# Patient Record
Sex: Male | Born: 1975 | Race: White | Hispanic: No | Smoking: Never smoker
Health system: Southern US, Community
[De-identification: ages and names within clinical notes are randomized; demographics above are authoritative.]

## PROBLEM LIST (undated history)

## (undated) HISTORY — PX: CERVICAL SPINE SURGERY: SHX589

---
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
## 2007-02-05 ENCOUNTER — Ambulatory Visit (HOSPITAL_COMMUNITY): Admission: RE | Admit: 2007-02-05 | Discharge: 2007-02-06 | Payer: Self-pay | Admitting: Neurosurgery

## 2010-08-30 NOTE — Op Note (Signed)
NAMEDONIS, PINDER                 ACCOUNT NO.:  [id number]   MEDICAL RECORD NO.:  [id number]          PATIENT TYPE:  AMB   LOCATION:  SDS                          FACILITY:  [facility]   PHYSICIAN:  Danae Orleans. Venetia Maxon, M.D.  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   PROCEDURE:  Anterior cervical decompression and fusion, C6-7, with  allograft bone wedge, morcellized bone autograft and anterior cervical  plate.   SURGEON:  Venetia Maxon, M.D.   ASSISTANTMichail Jewels, R.N., and Lovell Sheehan, M.D.   ANESTHESIA:  General endotracheal anesthesia.   BLOOD LOSS:  150 mL.   COMPLICATIONS:  None.   DISPOSITION:  To recovery.   INDICATIONS:  Chad Campbell is a 35 year old man with a herniated cervical  disk at C6-7 causing significant cord compression.  He has a cervical  myelopathy.  It was elected to take him to surgery for anterior cervical  decompression and fusion at the C6-7 level.   PROCEDURE:  Chad Campbell was brought to the operating room.  Following  satisfactory and uncomplicated induction of general endotracheal  anesthesia, the patient was placed in a supine position on the operating  table.  His neck was placed in neutral alignment.  He was placed in 10  pounds of halter traction.  His anterior neck was then shaved, prepped  and draped in the usual sterile fashion.  Area of planned incision was  infiltrated with 0.25% Marcaine, 0.25% lidocaine, 1:200,000 epinephrine.  Initially, incision was made from the midline to the anterior border of  the sternocleidomastoid muscle on  the left side of midline, carried  sharply through platysmal layer.  Subplatysmal dissection was performed,  exposing the anterior border of the sternocleidomastoid muscle, using  blunt dissection, the carotid sheath was kept lateral and the trachea  and esophagus kept medial, exposing the anterior cervical spine.  Initially, bent spinal needles were placed in what was felt to be the C5-  6 and C6-7 levels but on intraoperative x-ray was not possible to  visualize at this level.  Consequently, an additional needle was placed  that was felt to be at the C4-5 level, and this was confirmed on  intraoperative x-ray.  Subsequently, longus colli muscles were taken  down from C6 and C7 bilaterally using electrocautery and Key elevator,  and self-retaining Shadow-Line retractors were placed to facilitate  exposure.  Subsequently, the interspace at C6-7 was then incised.  Disk  material was removed in a piecemeal fashion, and endplates were stripped  of residual disk material using a variety of Carlen curettes.  Distraction pins were placed at C6 and C7, and using gentle distraction,  the disk space was further evacuated of residual disk material.  Endplates were decorticated with high-speed  drill, and uncinate spurs  were drilled down.  There were multiple fragments of disk material which  were removed from a hole in the posterior longitudinal ligament inflated  to the right of midline, and this resulted in significant decompression  of the cervical spinal cord dura.  Subsequently, under microscopic  visualization, the posterior longitudinal ligament was removed more  completely with decompression of both C7 nerve roots as they extended  out the neural foramina and with decompression of central spinal cord  dura.  Hemostasis was assured with Gelfoam soaked in thrombin.  The  drilling of the endplates were saved for later use with bone graft  material.  After trial sizing, an 8-mm Allograft bone  wedge was  fashioned with a high-speed drill and packed with morcellized bone  autograft and also demineralized bone matrix.  This was then inserted in  the interspace and countersunk appropriately.  Distraction pins were  removed.  A 14-mm Trestle anterior cervical plate was then affixed to  the anterior cervical spine using variable angle 14-mm screws, two at  C6, two at C7.  All screws had excellent purchase.  Locking mechanisms  were engaged.  It was elected not to perform final x-rays.  It was felt  it would not be possible to visualize this level.  Hemostasis assured,  and the soft tissues were inspected and found to be in good repair.  The  platysmal layer was closed with 3-0 Vicryl sutures, and the skin edges  were approximated 3-0 Vicryl interrupted inverted sutures.  The wound  was dressed with Dermabond.  The patient was x-rayed in the operating  room and taken to the recovery in stable satisfactory condition, having  tolerated his operation well.  Counts were correct at the end of the  case.      Danae Orleans. Venetia Maxon, M.D.  Electronically Signed     JDS/MEDQ  D:  02/05/2007  T:  02/06/2007  Job:  [id number]

## 2011-01-25 LAB — CBC
Hemoglobin: 16
MCV: 89.1
Platelets: 279
RBC: 5.26

## 2015-07-31 DIAGNOSIS — R69 Illness, unspecified: Secondary | ICD-10-CM | POA: Diagnosis not present

## 2015-07-31 DIAGNOSIS — J039 Acute tonsillitis, unspecified: Secondary | ICD-10-CM | POA: Diagnosis not present

## 2015-08-02 DIAGNOSIS — J039 Acute tonsillitis, unspecified: Secondary | ICD-10-CM | POA: Diagnosis not present

## 2015-08-02 DIAGNOSIS — J029 Acute pharyngitis, unspecified: Secondary | ICD-10-CM | POA: Diagnosis not present

## 2015-08-03 DIAGNOSIS — J029 Acute pharyngitis, unspecified: Secondary | ICD-10-CM | POA: Diagnosis not present

## 2016-02-03 DIAGNOSIS — Z23 Encounter for immunization: Secondary | ICD-10-CM | POA: Diagnosis not present

## 2016-06-20 DIAGNOSIS — J029 Acute pharyngitis, unspecified: Secondary | ICD-10-CM | POA: Diagnosis not present

## 2017-01-22 DIAGNOSIS — Z23 Encounter for immunization: Secondary | ICD-10-CM | POA: Diagnosis not present

## 2018-01-24 DIAGNOSIS — Z23 Encounter for immunization: Secondary | ICD-10-CM | POA: Diagnosis not present

## 2018-09-02 DIAGNOSIS — M25571 Pain in right ankle and joints of right foot: Secondary | ICD-10-CM | POA: Diagnosis not present

## 2018-09-11 DIAGNOSIS — M25571 Pain in right ankle and joints of right foot: Secondary | ICD-10-CM | POA: Diagnosis not present

## 2019-12-04 DIAGNOSIS — Z03818 Encounter for observation for suspected exposure to other biological agents ruled out: Secondary | ICD-10-CM | POA: Diagnosis not present

## 2019-12-04 DIAGNOSIS — Z20822 Contact with and (suspected) exposure to covid-19: Secondary | ICD-10-CM | POA: Diagnosis not present

## 2020-01-31 DIAGNOSIS — Z23 Encounter for immunization: Secondary | ICD-10-CM | POA: Diagnosis not present

## 2020-04-28 DIAGNOSIS — M19011 Primary osteoarthritis, right shoulder: Secondary | ICD-10-CM | POA: Diagnosis not present

## 2020-04-28 DIAGNOSIS — M25511 Pain in right shoulder: Secondary | ICD-10-CM | POA: Diagnosis not present

## 2020-09-01 DIAGNOSIS — H1045 Other chronic allergic conjunctivitis: Secondary | ICD-10-CM | POA: Diagnosis not present

## 2020-09-01 DIAGNOSIS — H5711 Ocular pain, right eye: Secondary | ICD-10-CM | POA: Diagnosis not present

## 2020-09-01 DIAGNOSIS — H0102A Squamous blepharitis right eye, upper and lower eyelids: Secondary | ICD-10-CM | POA: Diagnosis not present

## 2020-09-01 DIAGNOSIS — H40053 Ocular hypertension, bilateral: Secondary | ICD-10-CM | POA: Diagnosis not present

## 2020-09-09 DIAGNOSIS — R21 Rash and other nonspecific skin eruption: Secondary | ICD-10-CM | POA: Diagnosis not present

## 2020-09-09 DIAGNOSIS — H5711 Ocular pain, right eye: Secondary | ICD-10-CM | POA: Diagnosis not present

## 2021-01-08 ENCOUNTER — Emergency Department (HOSPITAL_COMMUNITY): Payer: BC Managed Care – PPO

## 2021-01-08 ENCOUNTER — Other Ambulatory Visit: Payer: Self-pay

## 2021-01-08 ENCOUNTER — Emergency Department (HOSPITAL_COMMUNITY)
Admission: EM | Admit: 2021-01-08 | Discharge: 2021-01-08 | Disposition: A | Discharge status: Home / Self Care | Payer: BC Managed Care – PPO | Attending: Emergency Medicine | Admitting: Emergency Medicine

## 2021-01-08 ENCOUNTER — Encounter (HOSPITAL_COMMUNITY): Payer: Self-pay | Admitting: Emergency Medicine

## 2021-01-08 DIAGNOSIS — M542 Cervicalgia: Secondary | ICD-10-CM | POA: Diagnosis not present

## 2021-01-08 DIAGNOSIS — R202 Paresthesia of skin: Secondary | ICD-10-CM | POA: Insufficient documentation

## 2021-01-08 MED ORDER — LIDOCAINE 5 % EX PTCH: 1.0000 | MEDICATED_PATCH | CUTANEOUS | 0 refills | Status: DC

## 2021-01-08 MED ORDER — CYCLOBENZAPRINE HCL 10 MG PO TABS
5.0000 mg | ORAL_TABLET | Freq: Once | ORAL | Status: AC
  Administered 2021-01-08: 5 mg via ORAL
  Filled 2021-01-08: qty 1

## 2021-01-08 MED ORDER — CYCLOBENZAPRINE HCL 5 MG PO TABS: 5.0000 mg | ORAL_TABLET | Freq: Three times a day (TID) | ORAL | 0 refills | Status: DC | PRN

## 2021-01-08 MED ORDER — KETOROLAC TROMETHAMINE 60 MG/2ML IM SOLN
60.0000 mg | Freq: Once | INTRAMUSCULAR | Status: AC
  Administered 2021-01-08: 60 mg via INTRAMUSCULAR
  Filled 2021-01-08: qty 2

## 2021-01-08 MED ORDER — LIDOCAINE 5 % EX PTCH
1.0000 | MEDICATED_PATCH | CUTANEOUS | Status: DC
  Administered 2021-01-08: 1 via TRANSDERMAL
  Filled 2021-01-08: qty 1

## 2021-01-08 MED ORDER — OXYCODONE-ACETAMINOPHEN 5-325 MG PO TABS
1.0000 | ORAL_TABLET | Freq: Once | ORAL | Status: AC
  Administered 2021-01-08: 1 via ORAL
  Filled 2021-01-08: qty 1

## 2021-01-08 NOTE — Discharge Instructions (Signed)
You have been seen and discharged from the emergency department.  Follow-up with your primary provider and orthopedics/spine for reevaluation and further care.  Take Tylenol and/or ibuprofen as needed for pain control.  Use the Lidoderm patches as prescribed.  Take muscle relaxer as needed.  Do not mix this medication with alcohol or other sedating medications. Do not drive or do heavy physical activity and to know how this medication affects you.  It may cause drowsiness.  Take home medications as prescribed. If you have any worsening symptoms, weakness in the left upper extremity, difficulty walking or further concerns for your health please return to an emergency department for further evaluation.

## 2021-01-08 NOTE — ED Triage Notes (Signed)
Woke up with L sided neck pain at 4:30am.  Denies injury.  History of cervical fusion.

## 2021-01-08 NOTE — ED Provider Notes (Signed)
[facility] Provider Note   CSN: [id number] Arrival date & time: 01/08/21  0845     History Chief Complaint  Patient presents with   Neck Pain    Chad [facility] is a 45 y.o. male.  HPI   45 year old male with past medical history of cervical spine fusion surgery with Dr. Venetia Maxon in 2008 presents with left sided neck pain. Patient states the pain is atraumatic, woke up with morning with soreness and tightness of left neck/shoulder. Patient has paraesthesias of LUE to hand at baseline which may feel slightly worse than baseline. No weakness or color change in the LUE. No fever or other neurologic deficit.   History reviewed. No pertinent past medical history.  There are no problems to display for this patient.   Past Surgical History:  Procedure Laterality Date   CERVICAL SPINE SURGERY         No family history on file.  Social History   Tobacco Use   Smoking status: Never   Smokeless tobacco: Never  Substance Use Topics   Alcohol use: Not Currently   Drug use: Not Currently    Home Medications Prior to Admission medications   Not on File    Allergies    Patient has no allergy information on record.  Review of Systems   Review of Systems  Constitutional:  Negative for fever.  HENT:  Negative for congestion.   Respiratory:  Negative for shortness of breath.   Cardiovascular:  Negative for chest pain.  Gastrointestinal:  Negative for abdominal pain.  Musculoskeletal:  Positive for neck pain. Negative for back pain.  Skin:  Negative for rash.  Neurological:  Negative for weakness, numbness and headaches.       + Paresthesias of the left upper extremity   Physical Exam Updated Vital Signs BP (!) 131/115 (BP Location: Right Arm)   Pulse 68   Temp 98.5 F (36.9 C) (Oral)   Resp 18   SpO2 98%   Physical Exam Vitals and nursing note reviewed.  Constitutional:      Appearance: Normal appearance.  HENT:     Head:  Normocephalic.     Mouth/Throat:     Mouth: Mucous membranes are moist.  Cardiovascular:     Rate and Rhythm: Normal rate.  Pulmonary:     Effort: Pulmonary effort is normal. No respiratory distress.  Abdominal:     Palpations: Abdomen is soft.     Tenderness: There is no abdominal tenderness.  Musculoskeletal:     Comments: Tenderness to palpation in the left trapezius muscle with mild muscle spasm noted, equal palpable radial pulses, grip strength and left upper extremity strength intact  Skin:    General: Skin is warm.  Neurological:     Mental Status: He is alert and oriented to person, place, and time. Mental status is at baseline.  Psychiatric:        Mood and Affect: Mood normal.    ED Results / Procedures / Treatments   Labs (all labs ordered are listed, but only abnormal results are displayed) Labs Reviewed - No data to display  EKG None  Radiology CT Cervical Spine Wo Contrast  Result Date: 01/08/2021 CLINICAL DATA:  Acute left-sided neck pain without known injury. EXAM: CT CERVICAL SPINE WITHOUT CONTRAST TECHNIQUE: Multidetector CT imaging of the cervical spine was performed without intravenous contrast. Multiplanar CT image reconstructions were also generated. COMPARISON:  February 05, 2007. FINDINGS: Alignment: Normal. Skull base  and vertebrae: No acute fracture. No primary bone lesion or focal pathologic process. Soft tissues and spinal canal: No prevertebral fluid or swelling. No visible canal hematoma. Disc levels: Status post surgical anterior fusion of C6-7. Mild anterior osteophyte formation is noted at C4-5 and C5-6. Upper chest: Negative. Other: None. IMPRESSION: Postsurgical and degenerative changes as described above. No acute abnormality is noted. Electronically Signed   By: Lupita Raider M.D.   On: 01/08/2021 10:33    Procedures Procedures   Medications Ordered in ED Medications  lidocaine (LIDODERM) 5 % 1 patch (has no administration in time range)   cyclobenzaprine (FLEXERIL) tablet 5 mg (has no administration in time range)  ketorolac (TORADOL) injection 60 mg (has no administration in time range)  oxyCODONE-acetaminophen (PERCOCET/ROXICET) 5-325 MG per tablet 1 tablet (1 tablet Oral Given 01/08/21 2130)    ED Course  I have reviewed the triage vital signs and the nursing notes.  Pertinent labs & imaging results that were available during my care of the patient were reviewed by me and considered in my medical decision making (see chart for details).    MDM Rules/Calculators/A&P                            45 year old male with previous C spine surgery presents with left sided neck pain and tightness. VSS, the LUE is neuro in tact although patient complains of acute on chcronci LUE paraesthesias.  CT C spine shows post operative changes with no acute finding. I believe his symtpoms are related to MSK spasm, reproducible with palp[ation. Plan for symptomatic treatment and outpatient follow up. Patient at this time appears safe and stable for discharge and will be treated as an outpatient.  Discharge plan and strict return to ED precautions discussed, patient verbalizes understanding and agreement.  Final Clinical Impression(s) / ED Diagnoses Final diagnoses:  None    Rx / DC Orders ED Discharge Orders     None        Chad Logan, DO 01/08/21 1352

## 2021-01-08 NOTE — ED Provider Notes (Signed)
Emergency Medicine Provider Triage Evaluation Note  [facility] , a 45 y.o. male  was evaluated in triage.  Pt complains of left-sided neck pain for the past 5 hours.  History of cervical fusion by Dr. Venetia Maxon in 2008.  Since then he has had intermittent but manageable pain.  Reports sudden worsening acute pain earlier this morning.  No injury or trauma or specific trigger that he is aware of.  He states that this is the exact symptoms he had prior to getting his cervical fusion.  He reports associated paresthesias down his left arm.  Denies any weakness, headache, vision changes, fever.  Review of Systems  Positive: Neck pain, paresthesias Negative: Headache, blurry vision, weakness  Physical Exam  BP (!) 146/110 (BP Location: Left Arm)   Pulse 82   Temp 98.5 F (36.9 C) (Oral)   Resp 16   SpO2 100%  Gen:   Awake, no distress   Resp:  Normal effort  MSK:   Moves extremities without difficulty  Other:  Tenderness palpation of the cervical spine at the left paraspinal musculature.  Strength 5/5 in upper extremities no facial asymmetry  Medical Decision Making  Medically screening exam initiated at 9:11 AM.  Appropriate orders placed.  PHUC KLUTTZ was informed that the remainder of the evaluation will be completed by another provider, this initial triage assessment does not replace that evaluation, and the importance of remaining in the ED until their evaluation is complete.  Imaging and pain control ordered   Dietrich Pates, PA-C 01/08/21 0912    Horton, Clabe Seal, DO 01/10/21 0730

## 2021-01-11 ENCOUNTER — Other Ambulatory Visit: Payer: Self-pay | Admitting: Orthopedic Surgery

## 2021-01-11 DIAGNOSIS — M542 Cervicalgia: Secondary | ICD-10-CM | POA: Diagnosis not present

## 2021-01-11 DIAGNOSIS — M5412 Radiculopathy, cervical region: Secondary | ICD-10-CM | POA: Diagnosis not present

## 2021-01-11 DIAGNOSIS — Z981 Arthrodesis status: Secondary | ICD-10-CM | POA: Diagnosis not present

## 2021-01-12 ENCOUNTER — Ambulatory Visit
Admission: RE | Admit: 2021-01-12 | Discharge: 2021-01-12 | Disposition: A | Discharge status: Home / Self Care | Payer: BC Managed Care – PPO | Source: Ambulatory Visit | Attending: Orthopedic Surgery | Admitting: Orthopedic Surgery

## 2021-01-12 ENCOUNTER — Other Ambulatory Visit: Payer: Self-pay

## 2021-01-12 DIAGNOSIS — M4802 Spinal stenosis, cervical region: Secondary | ICD-10-CM | POA: Diagnosis not present

## 2021-01-12 DIAGNOSIS — M542 Cervicalgia: Secondary | ICD-10-CM

## 2021-01-12 DIAGNOSIS — M5023 Other cervical disc displacement, cervicothoracic region: Secondary | ICD-10-CM | POA: Diagnosis not present

## 2021-01-12 DIAGNOSIS — M50222 Other cervical disc displacement at C5-C6 level: Secondary | ICD-10-CM | POA: Diagnosis not present

## 2021-01-12 DIAGNOSIS — Z981 Arthrodesis status: Secondary | ICD-10-CM | POA: Diagnosis not present

## 2021-01-12 MED ORDER — GADOBENATE DIMEGLUMINE 529 MG/ML IV SOLN
18.0000 mL | Freq: Once | INTRAVENOUS | Status: AC | PRN
  Administered 2021-01-12: 18 mL via INTRAVENOUS

## 2021-01-17 DIAGNOSIS — M542 Cervicalgia: Secondary | ICD-10-CM | POA: Diagnosis not present

## 2021-01-24 DIAGNOSIS — M2569 Stiffness of other specified joint, not elsewhere classified: Secondary | ICD-10-CM | POA: Diagnosis not present

## 2021-01-24 DIAGNOSIS — M6281 Muscle weakness (generalized): Secondary | ICD-10-CM | POA: Diagnosis not present

## 2021-01-24 DIAGNOSIS — M5412 Radiculopathy, cervical region: Secondary | ICD-10-CM | POA: Diagnosis not present

## 2021-01-24 DIAGNOSIS — R293 Abnormal posture: Secondary | ICD-10-CM | POA: Diagnosis not present

## 2021-01-26 DIAGNOSIS — M6281 Muscle weakness (generalized): Secondary | ICD-10-CM | POA: Diagnosis not present

## 2021-01-26 DIAGNOSIS — Z23 Encounter for immunization: Secondary | ICD-10-CM | POA: Diagnosis not present

## 2021-01-26 DIAGNOSIS — R293 Abnormal posture: Secondary | ICD-10-CM | POA: Diagnosis not present

## 2021-01-26 DIAGNOSIS — M5412 Radiculopathy, cervical region: Secondary | ICD-10-CM | POA: Diagnosis not present

## 2021-01-26 DIAGNOSIS — M2569 Stiffness of other specified joint, not elsewhere classified: Secondary | ICD-10-CM | POA: Diagnosis not present

## 2021-02-03 DIAGNOSIS — M5412 Radiculopathy, cervical region: Secondary | ICD-10-CM | POA: Diagnosis not present

## 2021-02-17 DIAGNOSIS — M5412 Radiculopathy, cervical region: Secondary | ICD-10-CM | POA: Diagnosis not present

## 2021-03-01 DIAGNOSIS — G5603 Carpal tunnel syndrome, bilateral upper limbs: Secondary | ICD-10-CM | POA: Diagnosis not present

## 2021-03-07 DIAGNOSIS — Z6829 Body mass index (BMI) 29.0-29.9, adult: Secondary | ICD-10-CM | POA: Diagnosis not present

## 2021-03-07 DIAGNOSIS — R030 Elevated blood-pressure reading, without diagnosis of hypertension: Secondary | ICD-10-CM | POA: Diagnosis not present

## 2021-03-08 ENCOUNTER — Other Ambulatory Visit: Payer: Self-pay | Admitting: Neurological Surgery

## 2021-03-18 ENCOUNTER — Other Ambulatory Visit: Payer: Self-pay | Admitting: Neurological Surgery

## 2021-03-21 DIAGNOSIS — M542 Cervicalgia: Secondary | ICD-10-CM | POA: Diagnosis not present

## 2021-03-21 DIAGNOSIS — Z6829 Body mass index (BMI) 29.0-29.9, adult: Secondary | ICD-10-CM | POA: Diagnosis not present

## 2021-03-21 DIAGNOSIS — M4802 Spinal stenosis, cervical region: Secondary | ICD-10-CM | POA: Diagnosis not present

## 2021-03-21 DIAGNOSIS — R030 Elevated blood-pressure reading, without diagnosis of hypertension: Secondary | ICD-10-CM | POA: Diagnosis not present

## 2021-03-28 NOTE — Progress Notes (Signed)
Surgical Instructions    Your procedure is scheduled on Friday December 16th.  Report to [facility] Main Entrance "A" at 9:20 A.M., then check in with the Admitting office.  Call this number if you have problems the morning of surgery:  [phone]   If you have any questions prior to your surgery date call [phone]: Open Monday-Friday 8am-4pm    Remember:  Do not eat or drink after midnight the night before your surgery     Take these medicines the morning of surgery with A SIP OF WATER NONE   As of today, STOP taking any Aspirin (unless otherwise instructed by your surgeon) Aleve, Naproxen, Ibuprofen, Motrin, Advil, Goody's, BC's, all herbal medications, fish oil, and all vitamins.     After your COVID test   You are not required to quarantine however you are required to wear a well-fitting mask when you are out and around people not in your household.  If your mask becomes wet or soiled, replace with a new one.  Wash your hands often with soap and water for 20 seconds or clean your hands with an alcohol-based hand sanitizer that contains at least 60% alcohol.  Do not share personal items.  Notify your provider: if you are in close contact with someone who has COVID  or if you develop a fever of 100.4 or greater, sneezing, cough, sore throat, shortness of breath or body aches.             Do not wear jewelry  Do not wear lotions, powders, colognes, or deodorant. Do not shave 48 hours prior to surgery.  Men may shave face and neck. Do not bring valuables to the hospital. DO Not wear nail polish, gel polish, artificial nails, or any other type of covering on natural nails including finger and toenails. If patients have artificial nails, gel coating, etc. that need to be removed by a nail salon, please have this removed prior to surgery or surgery may need to be canceled/delayed if the surgeon/ anesthesia feels like the patient is unable to be adequately monitored.              [facility] is not responsible for any belongings or valuables.  Do NOT Smoke (Tobacco/Vaping)  24 hours prior to your procedure  If you use a CPAP at night, you may bring your mask for your overnight stay.   Contacts, glasses, hearing aids, dentures or partials may not be worn into surgery, please bring cases for these belongings   For patients admitted to the hospital, discharge time will be determined by your treatment team.   Patients discharged the day of surgery will not be allowed to drive home, and someone needs to stay with them for 24 hours.  NO VISITORS WILL BE ALLOWED IN PRE-OP WHERE PATIENTS ARE PREPPED FOR SURGERY.  ONLY 1 SUPPORT PERSON MAY BE PRESENT IN THE WAITING ROOM WHILE YOU ARE IN SURGERY.  IF YOU ARE TO BE ADMITTED, ONCE YOU ARE IN YOUR ROOM YOU WILL BE ALLOWED TWO (2) VISITORS. 1 (ONE) VISITOR MAY STAY OVERNIGHT BUT MUST ARRIVE TO THE ROOM BY 8pm.  Minor children may have two parents present. Special consideration for safety and communication needs will be reviewed on a case by case basis.  Special instructions:    Oral Hygiene is also important to reduce your risk of infection.  Remember - BRUSH YOUR TEETH THE MORNING OF SURGERY WITH YOUR REGULAR TOOTHPASTE   - Preparing For Surgery  Before surgery, you can play an important role. Because skin is not sterile, your skin needs to be as free of germs as possible. You can reduce the number of germs on your skin by washing with CHG (chlorahexidine gluconate) Soap before surgery.  CHG is an antiseptic cleaner which kills germs and bonds with the skin to continue killing germs even after washing.     Please do not use if you have an allergy to CHG or antibacterial soaps. If your skin becomes reddened/irritated stop using the CHG.  Do not shave (including legs and underarms) for at least 48 hours prior to first CHG shower. It is OK to shave your face.  Please follow these instructions carefully.      Shower the NIGHT BEFORE SURGERY and the MORNING OF SURGERY with CHG Soap.   If you chose to wash your hair, wash your hair first as usual with your normal shampoo. After you shampoo, rinse your hair and body thoroughly to remove the shampoo.  Then [employer] and genitals (private parts) with your normal soap and rinse thoroughly to remove soap.  After that Use CHG Soap as you would any other liquid soap. You can apply CHG directly to the skin and wash gently with a scrungie or a clean washcloth.   Apply the CHG Soap to your body ONLY FROM THE NECK DOWN.  Do not use on open wounds or open sores. Avoid contact with your eyes, ears, mouth and genitals (private parts). Wash Face and genitals (private parts)  with your normal soap.   Wash thoroughly, paying special attention to the area where your surgery will be performed.  Thoroughly rinse your body with warm water from the neck down.  DO NOT shower/wash with your normal soap after using and rinsing off the CHG Soap.  Pat yourself dry with a CLEAN TOWEL.  Wear CLEAN PAJAMAS to bed the night before surgery  Place CLEAN SHEETS on your bed the night before your surgery  DO NOT SLEEP WITH PETS.   Day of Surgery:  Take a shower with CHG soap. Wear Clean/Comfortable clothing the morning of surgery Do not apply any deodorants/lotions.   Remember to brush your teeth WITH YOUR REGULAR TOOTHPASTE.   Please read over the following fact sheets that you were given.

## 2021-03-29 ENCOUNTER — Encounter (HOSPITAL_COMMUNITY): Payer: Self-pay

## 2021-03-29 ENCOUNTER — Encounter (HOSPITAL_COMMUNITY)
Admission: RE | Admit: 2021-03-29 | Discharge: 2021-03-29 | Disposition: A | Discharge status: Home / Self Care | Payer: BC Managed Care – PPO | Source: Ambulatory Visit | Attending: Neurological Surgery | Admitting: Neurological Surgery

## 2021-03-29 ENCOUNTER — Other Ambulatory Visit: Payer: Self-pay

## 2021-03-29 VITALS — BP 138/89 | HR 84 | Temp 97.8°F | Resp 18 | Ht 70.0 in | Wt 212.0 lb

## 2021-03-29 DIAGNOSIS — Z01812 Encounter for preprocedural laboratory examination: Secondary | ICD-10-CM | POA: Insufficient documentation

## 2021-03-29 DIAGNOSIS — Z20822 Contact with and (suspected) exposure to covid-19: Secondary | ICD-10-CM | POA: Diagnosis not present

## 2021-03-29 DIAGNOSIS — Z01818 Encounter for other preprocedural examination: Secondary | ICD-10-CM

## 2021-03-29 LAB — TYPE AND SCREEN
ABO/RH(D): O POS
Antibody Screen: NEGATIVE

## 2021-03-29 LAB — SURGICAL PCR SCREEN
MRSA, PCR: NEGATIVE
Staphylococcus aureus: NEGATIVE

## 2021-03-29 LAB — CBC
HCT: 48.6 % (ref 39.0–52.0)
Hemoglobin: 15.8 g/dL (ref 13.0–17.0)
MCH: 29.8 pg (ref 26.0–34.0)
MCHC: 32.5 g/dL (ref 30.0–36.0)
MCV: 91.5 fL (ref 80.0–100.0)
Platelets: 307 10*3/uL (ref 150–400)
RBC: 5.31 MIL/uL (ref 4.22–5.81)
RDW: 13 % (ref 11.5–15.5)
WBC: 6 10*3/uL (ref 4.0–10.5)
nRBC: 0 % (ref 0.0–0.2)

## 2021-03-29 LAB — SARS CORONAVIRUS 2 (TAT 6-24 HRS): SARS Coronavirus 2: NEGATIVE

## 2021-03-29 NOTE — Progress Notes (Signed)
PCP - [facility]  Chest x-ray - Not indicated EKG - Not indicated  COVID TEST-  03/29/21   Anesthesia review: No  Patient denies shortness of breath, fever, cough and chest pain at PAT appointment   All instructions explained to the patient, with a verbal understanding of the material. Patient agrees to go over the instructions while at home for a better understanding. Patient also instructed to wear a mask while in public after being tested for COVID-19. The opportunity to ask questions was provided.

## 2021-04-01 ENCOUNTER — Observation Stay (HOSPITAL_COMMUNITY)
Admission: RE | Admit: 2021-04-01 | Discharge: 2021-04-02 | Disposition: A | Discharge status: Home / Self Care | Payer: BC Managed Care – PPO | Attending: Neurological Surgery | Admitting: Neurological Surgery

## 2021-04-01 ENCOUNTER — Ambulatory Visit (HOSPITAL_COMMUNITY): Payer: BC Managed Care – PPO | Admitting: Certified Registered Nurse Anesthetist

## 2021-04-01 ENCOUNTER — Encounter (HOSPITAL_COMMUNITY): Payer: Self-pay | Admitting: Neurological Surgery

## 2021-04-01 ENCOUNTER — Ambulatory Visit (HOSPITAL_COMMUNITY): Payer: BC Managed Care – PPO

## 2021-04-01 ENCOUNTER — Encounter (HOSPITAL_COMMUNITY)
Admission: RE | Disposition: A | Discharge status: Home / Self Care | Payer: Self-pay | Source: Home / Self Care | Attending: Neurological Surgery

## 2021-04-01 ENCOUNTER — Other Ambulatory Visit: Payer: Self-pay

## 2021-04-01 DIAGNOSIS — M5412 Radiculopathy, cervical region: Secondary | ICD-10-CM | POA: Diagnosis present

## 2021-04-01 DIAGNOSIS — M4322 Fusion of spine, cervical region: Secondary | ICD-10-CM | POA: Diagnosis not present

## 2021-04-01 DIAGNOSIS — Z981 Arthrodesis status: Secondary | ICD-10-CM | POA: Diagnosis not present

## 2021-04-01 DIAGNOSIS — Z419 Encounter for procedure for purposes other than remedying health state, unspecified: Secondary | ICD-10-CM

## 2021-04-01 DIAGNOSIS — M4802 Spinal stenosis, cervical region: Secondary | ICD-10-CM | POA: Diagnosis not present

## 2021-04-01 DIAGNOSIS — M4722 Other spondylosis with radiculopathy, cervical region: Secondary | ICD-10-CM | POA: Diagnosis not present

## 2021-04-01 HISTORY — PX: ANTERIOR CERVICAL DECOMP/DISCECTOMY FUSION: SHX1161

## 2021-04-01 LAB — ABO/RH: ABO/RH(D): O POS

## 2021-04-01 SURGERY — ANTERIOR CERVICAL DECOMPRESSION/DISCECTOMY FUSION 1 LEVEL
Anesthesia: General | Site: Spine Cervical

## 2021-04-01 MED ORDER — SUGAMMADEX SODIUM 200 MG/2ML IV SOLN
INTRAVENOUS | Status: DC | PRN
  Administered 2021-04-01: 200 mg via INTRAVENOUS

## 2021-04-01 MED ORDER — LIDOCAINE 2% (20 MG/ML) 5 ML SYRINGE
INTRAMUSCULAR | Status: DC | PRN
  Administered 2021-04-01: 80 mg via INTRAVENOUS

## 2021-04-01 MED ORDER — SODIUM CHLORIDE 0.9 % IV SOLN: 250.0000 mL | INTRAVENOUS | Status: DC

## 2021-04-01 MED ORDER — ONDANSETRON HCL 4 MG/2ML IJ SOLN: 4.0000 mg | Freq: Four times a day (QID) | INTRAMUSCULAR | Status: DC | PRN

## 2021-04-01 MED ORDER — PHENOL 1.4 % MT LIQD: 1.0000 | OROMUCOSAL | Status: DC | PRN

## 2021-04-01 MED ORDER — LACTATED RINGERS IV SOLN: INTRAVENOUS | Status: DC | PRN

## 2021-04-01 MED ORDER — FENTANYL CITRATE (PF) 100 MCG/2ML IJ SOLN
INTRAMUSCULAR | Status: AC
  Filled 2021-04-01: qty 2

## 2021-04-01 MED ORDER — ONDANSETRON HCL 4 MG PO TABS: 4.0000 mg | ORAL_TABLET | Freq: Four times a day (QID) | ORAL | Status: DC | PRN

## 2021-04-01 MED ORDER — ACETAMINOPHEN 10 MG/ML IV SOLN
INTRAVENOUS | Status: AC
  Filled 2021-04-01: qty 100

## 2021-04-01 MED ORDER — CEFAZOLIN SODIUM-DEXTROSE 2-4 GM/100ML-% IV SOLN
2.0000 g | Freq: Three times a day (TID) | INTRAVENOUS | Status: AC
  Administered 2021-04-01 – 2021-04-02 (×2): 2 g via INTRAVENOUS
  Filled 2021-04-01 (×2): qty 100

## 2021-04-01 MED ORDER — KETOROLAC TROMETHAMINE 15 MG/ML IJ SOLN
15.0000 mg | Freq: Four times a day (QID) | INTRAMUSCULAR | Status: DC
  Administered 2021-04-01 – 2021-04-02 (×3): 15 mg via INTRAVENOUS
  Filled 2021-04-01 (×3): qty 1

## 2021-04-01 MED ORDER — CHLORHEXIDINE GLUCONATE CLOTH 2 % EX PADS: 6.0000 | MEDICATED_PAD | Freq: Once | CUTANEOUS | Status: DC

## 2021-04-01 MED ORDER — ACETAMINOPHEN 325 MG PO TABS: 650.0000 mg | ORAL_TABLET | ORAL | Status: DC | PRN

## 2021-04-01 MED ORDER — FENTANYL CITRATE (PF) 250 MCG/5ML IJ SOLN
INTRAMUSCULAR | Status: AC
  Filled 2021-04-01: qty 5

## 2021-04-01 MED ORDER — DOCUSATE SODIUM 100 MG PO CAPS
100.0000 mg | ORAL_CAPSULE | Freq: Two times a day (BID) | ORAL | Status: DC
  Administered 2021-04-01 – 2021-04-02 (×2): 100 mg via ORAL
  Filled 2021-04-01 (×2): qty 1

## 2021-04-01 MED ORDER — LIDOCAINE 2% (20 MG/ML) 5 ML SYRINGE
INTRAMUSCULAR | Status: AC
  Filled 2021-04-01: qty 5

## 2021-04-01 MED ORDER — HYDROMORPHONE HCL 1 MG/ML IJ SOLN
0.5000 mg | INTRAMUSCULAR | Status: DC | PRN
  Administered 2021-04-01: 0.5 mg via INTRAVENOUS
  Filled 2021-04-01: qty 0.5

## 2021-04-01 MED ORDER — DEXAMETHASONE SODIUM PHOSPHATE 10 MG/ML IJ SOLN
INTRAMUSCULAR | Status: AC
  Filled 2021-04-01: qty 2

## 2021-04-01 MED ORDER — DEXMEDETOMIDINE (PRECEDEX) IN NS 20 MCG/5ML (4 MCG/ML) IV SYRINGE
PREFILLED_SYRINGE | INTRAVENOUS | Status: DC | PRN
  Administered 2021-04-01 (×2): 8 ug via INTRAVENOUS
  Administered 2021-04-01: 4 ug via INTRAVENOUS

## 2021-04-01 MED ORDER — MIDAZOLAM HCL 2 MG/2ML IJ SOLN
INTRAMUSCULAR | Status: DC | PRN
  Administered 2021-04-01: 2 mg via INTRAVENOUS

## 2021-04-01 MED ORDER — HYDROCODONE-ACETAMINOPHEN 7.5-325 MG PO TABS
2.0000 | ORAL_TABLET | ORAL | Status: DC | PRN
  Administered 2021-04-02: 2 via ORAL
  Filled 2021-04-01 (×2): qty 2

## 2021-04-01 MED ORDER — FENTANYL CITRATE (PF) 100 MCG/2ML IJ SOLN
25.0000 ug | INTRAMUSCULAR | Status: DC | PRN
  Administered 2021-04-01 (×3): 50 ug via INTRAVENOUS

## 2021-04-01 MED ORDER — SODIUM CHLORIDE 0.9% FLUSH
3.0000 mL | Freq: Two times a day (BID) | INTRAVENOUS | Status: DC
  Administered 2021-04-01: 3 mL via INTRAVENOUS

## 2021-04-01 MED ORDER — PHENYLEPHRINE 40 MCG/ML (10ML) SYRINGE FOR IV PUSH (FOR BLOOD PRESSURE SUPPORT)
PREFILLED_SYRINGE | INTRAVENOUS | Status: AC
  Filled 2021-04-01: qty 10

## 2021-04-01 MED ORDER — HYDROCODONE-ACETAMINOPHEN 7.5-325 MG PO TABS
1.0000 | ORAL_TABLET | ORAL | Status: DC | PRN
  Administered 2021-04-01 – 2021-04-02 (×3): 1 via ORAL
  Filled 2021-04-01 (×3): qty 1

## 2021-04-01 MED ORDER — ONDANSETRON HCL 4 MG/2ML IJ SOLN
INTRAMUSCULAR | Status: AC
  Filled 2021-04-01: qty 2

## 2021-04-01 MED ORDER — OXYCODONE HCL 5 MG PO TABS: 5.0000 mg | ORAL_TABLET | Freq: Once | ORAL | Status: DC | PRN

## 2021-04-01 MED ORDER — SODIUM CHLORIDE 0.9% FLUSH: 3.0000 mL | INTRAVENOUS | Status: DC | PRN

## 2021-04-01 MED ORDER — METHOCARBAMOL 500 MG PO TABS
500.0000 mg | ORAL_TABLET | Freq: Four times a day (QID) | ORAL | Status: DC | PRN
  Administered 2021-04-01 – 2021-04-02 (×2): 500 mg via ORAL
  Filled 2021-04-01 (×2): qty 1

## 2021-04-01 MED ORDER — ACETAMINOPHEN 650 MG RE SUPP: 650.0000 mg | RECTAL | Status: DC | PRN

## 2021-04-01 MED ORDER — THROMBIN 5000 UNITS EX SOLR
CUTANEOUS | Status: AC
  Filled 2021-04-01: qty 5000

## 2021-04-01 MED ORDER — DEXAMETHASONE SODIUM PHOSPHATE 10 MG/ML IJ SOLN
INTRAMUSCULAR | Status: DC | PRN
  Administered 2021-04-01: 10 mg via INTRAVENOUS

## 2021-04-01 MED ORDER — ONDANSETRON HCL 4 MG/2ML IJ SOLN
INTRAMUSCULAR | Status: DC | PRN
  Administered 2021-04-01: 4 mg via INTRAVENOUS

## 2021-04-01 MED ORDER — FENTANYL CITRATE (PF) 250 MCG/5ML IJ SOLN
INTRAMUSCULAR | Status: DC | PRN
  Administered 2021-04-01: 50 ug via INTRAVENOUS
  Administered 2021-04-01: 100 ug via INTRAVENOUS
  Administered 2021-04-01 (×2): 50 ug via INTRAVENOUS

## 2021-04-01 MED ORDER — ACETAMINOPHEN 10 MG/ML IV SOLN
INTRAVENOUS | Status: DC | PRN
  Administered 2021-04-01: 1000 mg via INTRAVENOUS

## 2021-04-01 MED ORDER — METHOCARBAMOL 1000 MG/10ML IJ SOLN
500.0000 mg | Freq: Four times a day (QID) | INTRAVENOUS | Status: DC | PRN
  Filled 2021-04-01: qty 5

## 2021-04-01 MED ORDER — OXYCODONE HCL 5 MG/5ML PO SOLN: 5.0000 mg | Freq: Once | ORAL | Status: DC | PRN

## 2021-04-01 MED ORDER — MENTHOL 3 MG MT LOZG: 1.0000 | LOZENGE | OROMUCOSAL | Status: DC | PRN

## 2021-04-01 MED ORDER — DEXMEDETOMIDINE (PRECEDEX) IN NS 20 MCG/5ML (4 MCG/ML) IV SYRINGE
PREFILLED_SYRINGE | INTRAVENOUS | Status: AC
  Filled 2021-04-01: qty 5

## 2021-04-01 MED ORDER — THROMBIN 5000 UNITS EX SOLR: OROMUCOSAL | Status: DC | PRN

## 2021-04-01 MED ORDER — ORAL CARE MOUTH RINSE: 15.0000 mL | Freq: Once | OROMUCOSAL | Status: AC

## 2021-04-01 MED ORDER — TRIAMCINOLONE ACETONIDE 40 MG/ML IJ SUSP
INTRAMUSCULAR | Status: AC
  Filled 2021-04-01: qty 5

## 2021-04-01 MED ORDER — ROCURONIUM BROMIDE 10 MG/ML (PF) SYRINGE
PREFILLED_SYRINGE | INTRAVENOUS | Status: DC | PRN
  Administered 2021-04-01: 20 mg via INTRAVENOUS
  Administered 2021-04-01: 70 mg via INTRAVENOUS
  Administered 2021-04-01: 10 mg via INTRAVENOUS

## 2021-04-01 MED ORDER — PROPOFOL 10 MG/ML IV BOLUS
INTRAVENOUS | Status: DC | PRN
  Administered 2021-04-01: 200 mg via INTRAVENOUS

## 2021-04-01 MED ORDER — ROCURONIUM BROMIDE 10 MG/ML (PF) SYRINGE
PREFILLED_SYRINGE | INTRAVENOUS | Status: AC
  Filled 2021-04-01: qty 20

## 2021-04-01 MED ORDER — LIDOCAINE 2% (20 MG/ML) 5 ML SYRINGE
INTRAMUSCULAR | Status: AC
  Filled 2021-04-01: qty 10

## 2021-04-01 MED ORDER — 0.9 % SODIUM CHLORIDE (POUR BTL) OPTIME
TOPICAL | Status: DC | PRN
  Administered 2021-04-01: 1000 mL

## 2021-04-01 MED ORDER — CEFAZOLIN SODIUM-DEXTROSE 2-4 GM/100ML-% IV SOLN
2.0000 g | INTRAVENOUS | Status: AC
  Administered 2021-04-01: 2 g via INTRAVENOUS
  Filled 2021-04-01: qty 100

## 2021-04-01 MED ORDER — ROCURONIUM BROMIDE 10 MG/ML (PF) SYRINGE
PREFILLED_SYRINGE | INTRAVENOUS | Status: AC
  Filled 2021-04-01: qty 10

## 2021-04-01 MED ORDER — LACTATED RINGERS IV SOLN: INTRAVENOUS | Status: DC

## 2021-04-01 MED ORDER — MIDAZOLAM HCL 2 MG/2ML IJ SOLN
INTRAMUSCULAR | Status: AC
  Filled 2021-04-01: qty 2

## 2021-04-01 MED ORDER — CHLORHEXIDINE GLUCONATE 0.12 % MT SOLN
15.0000 mL | Freq: Once | OROMUCOSAL | Status: AC
  Administered 2021-04-01: 15 mL via OROMUCOSAL
  Filled 2021-04-01: qty 15

## 2021-04-01 MED ORDER — PROPOFOL 10 MG/ML IV BOLUS
INTRAVENOUS | Status: AC
  Filled 2021-04-01: qty 20

## 2021-04-01 SURGICAL SUPPLY — 66 items
ADH SKN CLS APL DERMABOND .7 (GAUZE/BANDAGES/DRESSINGS) ×1
APL SKNCLS STERI-STRIP NONHPOA (GAUZE/BANDAGES/DRESSINGS)
BAG COUNTER SPONGE SURGICOUNT (BAG) ×2 IMPLANT
BAG SPNG CNTER NS LX DISP (BAG) ×1
BAG SURGICOUNT SPONGE COUNTING (BAG) ×1
BAND INSRT 18 STRL LF DISP RB (MISCELLANEOUS) ×2
BAND RUBBER #18 3X1/16 STRL (MISCELLANEOUS) ×6 IMPLANT
BENZOIN TINCTURE PRP APPL 2/3 (GAUZE/BANDAGES/DRESSINGS) IMPLANT
BIT DRILL NEURO 2X3.1 SFT TUCH (MISCELLANEOUS) ×1 IMPLANT
BLADE CLIPPER SURG (BLADE) IMPLANT
BUR CARBIDE MATCH 3.0 (BURR) ×3 IMPLANT
CANISTER SUCT 3000ML PPV (MISCELLANEOUS) ×3 IMPLANT
CARTRIDGE OIL MAESTRO DRILL (MISCELLANEOUS) ×1 IMPLANT
CLOSURE WOUND 1/2 X4 (GAUZE/BANDAGES/DRESSINGS) ×1
COVER MAYO STAND STRL (DRAPES) ×6 IMPLANT
DERMABOND ADVANCED (GAUZE/BANDAGES/DRESSINGS) ×2
DERMABOND ADVANCED .7 DNX12 (GAUZE/BANDAGES/DRESSINGS) IMPLANT
DIFFUSER DRILL AIR PNEUMATIC (MISCELLANEOUS) ×3 IMPLANT
DRAPE C-ARM 42X72 X-RAY (DRAPES) ×3 IMPLANT
DRAPE HALF SHEET 40X57 (DRAPES) IMPLANT
DRAPE LAPAROTOMY 100X72X124 (DRAPES) ×3 IMPLANT
DRAPE MICROSCOPE LEICA (MISCELLANEOUS) ×3 IMPLANT
DRILL NEURO 2X3.1 SOFT TOUCH (MISCELLANEOUS) ×3
DRSG OPSITE POSTOP 4X6 (GAUZE/BANDAGES/DRESSINGS) ×2 IMPLANT
DURAPREP 6ML APPLICATOR 50/CS (WOUND CARE) ×3 IMPLANT
ELECT COATED BLADE 2.86 ST (ELECTRODE) ×3 IMPLANT
ELECT REM PT RETURN 9FT ADLT (ELECTROSURGICAL) ×3
ELECTRODE REM PT RTRN 9FT ADLT (ELECTROSURGICAL) ×1 IMPLANT
EVACUATOR 1/8 PVC DRAIN (DRAIN) IMPLANT
GAUZE 4X4 16PLY ~~LOC~~+RFID DBL (SPONGE) IMPLANT
GLOVE EXAM NITRILE LRG STRL (GLOVE) IMPLANT
GLOVE EXAM NITRILE XL STR (GLOVE) IMPLANT
GLOVE EXAM NITRILE XS STR PU (GLOVE) IMPLANT
GLOVE SRG 8 PF TXTR STRL LF DI (GLOVE) ×1 IMPLANT
GLOVE SURG LTX SZ8 (GLOVE) ×6 IMPLANT
GLOVE SURG UNDER POLY LF SZ8 (GLOVE) ×3
GOWN STRL REUS W/ TWL LRG LVL3 (GOWN DISPOSABLE) IMPLANT
GOWN STRL REUS W/ TWL XL LVL3 (GOWN DISPOSABLE) ×1 IMPLANT
GOWN STRL REUS W/TWL 2XL LVL3 (GOWN DISPOSABLE) IMPLANT
GOWN STRL REUS W/TWL LRG LVL3 (GOWN DISPOSABLE)
GOWN STRL REUS W/TWL XL LVL3 (GOWN DISPOSABLE) ×3
HEMOSTAT POWDER KIT SURGIFOAM (HEMOSTASIS) ×3 IMPLANT
KIT BASIN OR (CUSTOM PROCEDURE TRAY) ×3 IMPLANT
KIT TURNOVER KIT B (KITS) ×3 IMPLANT
NDL SPNL 18GX3.5 QUINCKE PK (NEEDLE) ×1 IMPLANT
NEEDLE HYPO 22GX1.5 SAFETY (NEEDLE) ×3 IMPLANT
NEEDLE SPNL 18GX3.5 QUINCKE PK (NEEDLE) ×3 IMPLANT
NS IRRIG 1000ML POUR BTL (IV SOLUTION) ×3 IMPLANT
OIL CARTRIDGE MAESTRO DRILL (MISCELLANEOUS) ×3
PACK LAMINECTOMY NEURO (CUSTOM PROCEDURE TRAY) ×3 IMPLANT
PAD ARMBOARD 7.5X6 YLW CONV (MISCELLANEOUS) ×9 IMPLANT
PIN DISTRACTION 14MM (PIN) ×6 IMPLANT
PLATE CERV CONS OZARK 1X28 (Plate) ×2 IMPLANT
PUTTY BONE 100 VESUVIUS 1CC (Putty) ×2 IMPLANT
SCREW CERV FX OZARK 4.5X14 (Screw) ×4 IMPLANT
SCREW VA ST OZARK 4X14 (Screw) ×4 IMPLANT
SPACER ANGLD CASCAD 16X13X7 7D (Spacer) ×2 IMPLANT
SPONGE INTESTINAL PEANUT (DISPOSABLE) ×9 IMPLANT
SPONGE SURGIFOAM ABS GEL SZ50 (HEMOSTASIS) ×3 IMPLANT
STAPLER VISISTAT 35W (STAPLE) IMPLANT
STRIP CLOSURE SKIN 1/2X4 (GAUZE/BANDAGES/DRESSINGS) ×2 IMPLANT
TAPE SURG TRANSPORE 1 IN (GAUZE/BANDAGES/DRESSINGS) ×1 IMPLANT
TAPE SURGICAL TRANSPORE 1 IN (GAUZE/BANDAGES/DRESSINGS) ×3
TOWEL GREEN STERILE (TOWEL DISPOSABLE) ×3 IMPLANT
TOWEL GREEN STERILE FF (TOWEL DISPOSABLE) ×3 IMPLANT
WATER STERILE IRR 1000ML POUR (IV SOLUTION) ×3 IMPLANT

## 2021-04-01 NOTE — H&P (Signed)
° ° °  Providing Compassionate, Quality Care - Together  NEUROSURGERY HISTORY & PHYSICAL   Chad [facility] is an 45 y.o. male.   Chief Complaint: Bilateral, left greater than right shoulder and arm radiculopathy HPI: This is a 45 year old male with a history of a C6-7 ACDF in 2008, that has had progressively worsening neck pain and bilateral, left greater than right shoulder pain and radiating pain into his fingertips over the past 6 months.  He had undergone multiple conservative measures which did not help him significantly.  MRI cervical spine revealed degenerative disc disease with disc protrusion and stenosis left greater than right at C5-6 adjacent to his previous C6-7 ACDF.  He presents today for surgical intervention.  He has no changes in his symptomatology from his prior visit.  History reviewed. No pertinent past medical history.  Past Surgical History:  Procedure Laterality Date   CERVICAL SPINE SURGERY     KNEE SURGERY Left 1996   tendon repair and patella   WISDOM TOOTH EXTRACTION Bilateral 1997    History reviewed. No pertinent family history. Social History:  reports that he has never smoked. He has never used smokeless tobacco. He reports current alcohol use. He reports that he does not use drugs.  Allergies: No Known Allergies  Medications Prior to Admission  Medication Sig Dispense Refill   cyclobenzaprine (FLEXERIL) 5 MG tablet Take 1 tablet (5 mg total) by mouth 3 (three) times daily as needed for muscle spasms. (Patient not taking: Reported on 03/23/2021) 15 tablet 0   lidocaine (LIDODERM) 5 % Place 1 patch onto the skin daily. Remove & Discard patch within 12 hours or as directed by MD (Patient not taking: Reported on 03/23/2021) 30 patch 0    Results for orders placed or performed during the hospital encounter of 04/01/21 (from the past 48 hour(s))  ABO/Rh     Status: None (Preliminary result)   Collection Time: 04/01/21  9:53 AM  Result Value Ref Range    ABO/RH(D) PENDING    No results found.  ROS All positives and negatives are listed in HPI above  Blood pressure (!) 140/97, pulse 86, temperature 98.2 F (36.8 C), temperature source Oral, resp. rate 18, height 5\' 10"  (1.778 m), weight 93 kg, SpO2 100 %. 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  We discussed all risks, benefits and expected outcomes and he agrees to proceed with surgical intervention.  We did discuss alternatives of treatment.  I answered all of his questions.  Informed consent was obtained.   Thank you for allowing me to participate in this patient's care.  Please do not hesitate to call with questions or concerns.   57, DO Neurosurgeon [facility] Cell: [phone]

## 2021-04-01 NOTE — Anesthesia Preprocedure Evaluation (Signed)
Anesthesia Evaluation  Patient identified by MRN, date of birth, ID band Patient awake    Reviewed: Allergy & Precautions, H&P , NPO status , Patient's Chart, lab work & pertinent test results  Airway Mallampati: II   Neck ROM: full    Dental   Pulmonary neg pulmonary ROS,    breath sounds clear to auscultation       Cardiovascular negative cardio ROS   Rhythm:regular Rate:Normal     Neuro/Psych    GI/Hepatic   Endo/Other    Renal/GU      Musculoskeletal Previous C-spine surgery   Abdominal   Peds  Hematology   Anesthesia Other Findings   Reproductive/Obstetrics                             Anesthesia Physical Anesthesia Plan  ASA: 1  Anesthesia Plan: General   Post-op Pain Management:    Induction: Intravenous  PONV Risk Score and Plan: 2 and Ondansetron, Dexamethasone, Midazolam and Treatment may vary due to age or medical condition  Airway Management Planned: Oral ETT and Video Laryngoscope Planned  Additional Equipment:   Intra-op Plan:   Post-operative Plan: Extubation in OR  Informed Consent: I have reviewed the patients History and Physical, chart, labs and discussed the procedure including the risks, benefits and alternatives for the proposed anesthesia with the patient or authorized representative who has indicated his/her understanding and acceptance.     Dental advisory given  Plan Discussed with: CRNA, Anesthesiologist and Surgeon  Anesthesia Plan Comments:         Anesthesia Quick Evaluation

## 2021-04-01 NOTE — Op Note (Signed)
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 Arthrodesis C5-6, anterior interbody technique  2. Placement of intervertebral biomechanical device C5-6, K2 M [place] titanium interbody device 7 x 13 x 16 mm 3. Placement of anterior instrumentation consisting of interbody plate and screws -K2 M [place] plate with 4.5 x 40 mm screws in C6, 4.0 x 40 mm screws in C5 4. Discectomy at C5-6 for decompression of spinal cord and exiting nerve roots  5.  Exploration of fusion with removal of anterior cervical plate and screws at C6-7 6.  Intraoperative use of autograft, same incision 7. Use of morselized bone allograft  8. Use of intraoperative microscope  SURGEON: Dr. Kendell Bane Via Rosado, DO  ASSISTANT: Docia Barrier, NP  ANESTHESIA: General Endotracheal  EBL: 50 cc   SPECIMENS: None  DRAINS: None  COMPLICATIONS: None immediate  CONDITION: Hemodynamically stable to PACU  HISTORY: Chad Campbell is a 45 y.o. y.o. male who initially presented to the outpatient clinic with signs and symptoms consistent with left greater than right C6 radiculopathy and neck pain. MRI demonstrated adjacent segment disease to his previous C6-7 fusion that was performed in 2008.  He had significant central stenosis without cord compression, with severe left lateral recess and foraminal stenosis and moderate to severe right lateral recess and foraminal stenosis.  He failed conservative measures including pain control and steroid injections. Treatment options were discussed including continued conservative measures, versus surgical exploration of fusion at C6-7, anterior cervical discectomy and interbody fusion at C5-6. After all questions were answered, informed consent was obtained.  All risks, benefits and expected outcomes were discussed and  agreed upon including but not limited to stroke, heart attack, death, esophageal injury, tracheal injury, recurrent laryngeal injury, hardware failure, the need for more surgery.  PROCEDURE IN DETAIL: The patient was brought to the operating room and transferred to the operative table. After induction of general anesthesia, the patient was positioned on the operative table in the supine position with all pressure points meticulously padded. The skin of the neck was then prepped and draped in the usual sterile fashion.  Physician driven timeout was performed.  After timeout was conducted, skin incision was then made sharply with a 10 blade and Bovie electrocautery was used to dissect the subcutaneous tissue until the platysma was identified. The platysma was then divided and undermined. The sternocleidomastoid muscle was then identified and, utilizing natural fascial planes in the neck, the prevertebral fascia was identified and the carotid sheath was retracted laterally and the trachea and esophagus retracted medially. Again using fluoroscopy, the correct disc space was identified.  The plate over V8-5 was identified.  This was removed with removal set, the previous screws were 4.0 mm x 43 mm.  The fusion was adequate across this interspace.  Hemostasis within the previous screw sites were achieved with Surgifoam.  Bovie electrocautery was used to dissect in the subperiosteal plane and elevate the bilateral longus coli muscles at C6 and C5. Self-retaining retractors were then placed under the longus coli muscles bilaterally. At this point, the microscope was draped and brought into the field, and the remainder of the case was done under the microscope using microdissecting technique.  Distraction pins were placed in midline above and below the disc space of C5-6.  The disc space was placed in distraction.  Anterior osteophytes were removed with an  osteophyte rongeur and saved for autograft.  The disc space  was incised sharply and rongeurs were use to initially complete a discectomy. The high-speed drill was then used to complete discectomy until the posterior annulus was identified and removed and the posterior longitudinal ligament was identified. Using microcurettes, the PLL was elevated, and Kerrison rongeurs were used to remove the posterior longitudinal ligament and the ventral thecal sac was identified.  There were multiple sizable central and lateral recess disc herniations, left greater than right. Using a combination of curettes and ronguers, complete decompression of the thecal sac and exiting nerve roots at this level was completed, and verified using micro-nerve hook. The disc space was taken out of distraction.   Having completed our decompression, attention was turned to placement of the intervertebral device. Trial spacers were used to select a 7 mm graft. This graft was then filled with morcellized allograft and autograft, and inserted under live fluoroscopy.  After placement of the intervertebral device, the above anterior cervical plate was selected, and placed across the interspace. Using a high-speed drill, the cortex of the cervical vertebral bodies was punctured, and screws inserted in the level above and below, using the previous screw tracks of the prior fusion. Final fluoroscopic images in AP and lateral projections were taken to confirm good hardware placement.  The screws were locked in place.  At this point, after all counts were verified to be correct, meticulous hemostasis was secured using a combination of bipolar electrocautery and passive hemostatics.  The platysma was closed with 2-0 Vicryl sutures.  The dermis was closed with 3-0 Vicryl sutures.  Skin was closed with skin glue.  Sterile dressing was applied.  The patient tolerated the procedure well and was extubated in the room and taken to the postanesthesia care unit in stable condition.

## 2021-04-01 NOTE — Progress Notes (Signed)
Orthopedic Tech Progress Note Patient Details:  MATHESON VANDEHEI [birth year] [id number]  Patient ID: Hetty Blend, male   DOB: 10-19-1975, 45 y.o.   MRN: [id number]  Delorise Royals Edwing Figley 04/01/2021, 6:41 PM Spoke with RN and patient already has collar

## 2021-04-01 NOTE — Transfer of Care (Signed)
Immediate Anesthesia Transfer of Care Note  Patient: Chad Campbell  Procedure(s) Performed: CERVICAL FIVE-SIX ANTERIOR CERVICAL DECOMPRESSION/DISCECTOMY FUSION WITH EXTENSION OF CERVICAL SIX-SEVEN FUSION (Spine Cervical)  Patient Location: PACU  Anesthesia Type:General  Level of Consciousness: awake, alert  and oriented  Airway & Oxygen Therapy: Patient Spontanous Breathing and Patient connected to nasal cannula oxygen  Post-op Assessment: Report given to RN and Post -op Vital signs reviewed and stable  Post vital signs: Reviewed and stable  Last Vitals:  Vitals Value Taken Time  BP 131/95 04/01/21 1438  Temp    Pulse 97 04/01/21 1442  Resp 50 04/01/21 1442  SpO2 92 % 04/01/21 1442  Vitals shown include unvalidated device data.  Last Pain:  Vitals:   04/01/21 0928  TempSrc:   PainSc: 7          Complications: No notable events documented.

## 2021-04-01 NOTE — Progress Notes (Signed)
° °  Providing Compassionate, Quality Care - Together  NEUROSURGERY PROGRESS NOTE   S: pt s/e in pacu, radiculopathy improved  O: EXAM:  BP (!) 131/95 (BP Location: Right Arm)    Pulse 97    Temp 97.8 F (36.6 C)    Resp 20    Ht 5\' 10"  (1.778 m)    Wt 93 kg    SpO2 92%    BMI 29.41 kg/m   Awake, alert, oriented x3 PERRL Neck soft Trachea midline CNs grossly intact  5/5 BUE/BLE   ASSESSMENT:  45 y.o. 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  Please do not hesitate to call with questions or concerns.   57, DO Neurosurgeon [facility] Cell: [phone]

## 2021-04-01 NOTE — Anesthesia Procedure Notes (Signed)
Procedure Name: Intubation Date/Time: 04/01/2021 12:11 PM Performed by: Epifanio Lesches, CRNA Pre-anesthesia Checklist: Patient identified, Emergency Drugs available, Suction available, Timeout performed and Patient being monitored Patient Re-evaluated:Patient Re-evaluated prior to induction Oxygen Delivery Method: Circle system utilized Preoxygenation: Pre-oxygenation with 100% oxygen Induction Type: IV induction Ventilation: Mask ventilation without difficulty Laryngoscope Size: Glidescope and 4 Grade View: Grade I Tube type: Oral Tube size: 7.5 mm Number of attempts: 1 Airway Equipment and Method: Stylet and Video-laryngoscopy Placement Confirmation: ETT inserted through vocal cords under direct vision, positive ETCO2, CO2 detector and breath sounds checked- equal and bilateral Secured at: 22 cm Tube secured with: Tape Dental Injury: Teeth and Oropharynx as per pre-operative assessment

## 2021-04-02 DIAGNOSIS — M4722 Other spondylosis with radiculopathy, cervical region: Secondary | ICD-10-CM | POA: Diagnosis not present

## 2021-04-02 MED ORDER — METHOCARBAMOL 500 MG PO TABS
500.0000 mg | ORAL_TABLET | Freq: Four times a day (QID) | ORAL | 1 refills | Status: DC | PRN
Start: 2021-04-02 — End: 2021-11-07

## 2021-04-02 MED ORDER — HYDROCODONE-ACETAMINOPHEN 7.5-325 MG PO TABS: 1.0000 | ORAL_TABLET | ORAL | 0 refills | Status: DC | PRN

## 2021-04-02 NOTE — Evaluation (Signed)
Physical Therapy Evaluation Patient Details Name: Chad Campbell MRN: [id number] DOB: [birth year] Today's Date: 04/02/2021  History of Present Illness  45 y.o. male presents to [facility] hospital on 04/01/2021 with neck pain radiating into BUE. MRI cervical spine revealed degenerative disc disease with disc protrusion and stenosis left greater than right at C5-6 adjacent to his previous C6-7 ACDF. Pt underwent C5-6 ACDF and removal of C6-7 hardware on 12/16. PMH includes left patella tendon repair.  Clinical Impression  Pt presents to PT mobilizing at a modI level. Pt ambulates with a brisk pace, demonstrating independent balance at this time. Pt expresses no concerns about mobility or ADL management and the patient is eager to discharge home. Pt has no further acute PT needs. Acute PT signing off.       Recommendations for follow up therapy are one component of a multi-disciplinary discharge planning process, led by the attending physician.  Recommendations may be updated based on patient status, additional functional criteria and insurance authorization.  Follow Up Recommendations No PT follow up    Assistance Recommended at Discharge None  Functional Status Assessment Patient has not had a recent decline in their functional status  Equipment Recommendations  None recommended by PT    Recommendations for Other Services       Precautions / Restrictions Precautions Precautions: Cervical Precaution Booklet Issued: Yes (comment) Restrictions Weight Bearing Restrictions: No      Mobility  Bed Mobility Overal bed mobility: Modified Independent             General bed mobility comments: HOB elevated, PT provides reinforcement to log roll when in flat bed    Transfers Overall transfer level: Independent                      Ambulation/Gait Ambulation/Gait assistance: Independent Gait Distance (Feet): 300 Feet Assistive device: None Gait Pattern/deviations: WFL(Within  Functional Limits) Gait velocity: functional Gait velocity interpretation: >4.37 ft/sec, indicative of normal walking speed      Stairs Stairs: Yes Stairs assistance: Modified independent (Device/Increase time) Stair Management: One rail Right;Alternating pattern;Forwards Number of Stairs: 10    Wheelchair Mobility    Modified Rankin (Stroke Patients Only)       Balance Overall balance assessment: Independent                                           Pertinent Vitals/Pain Pain Assessment: 0-10 Pain Score: 7  Pain Location: neck Pain Descriptors / Indicators: Sore Pain Intervention(s): Monitored during session    Home Living Family/patient expects to be discharged to:: Private residence Living Arrangements: Parent;Children;Spouse/significant other Available Help at Discharge: Family;Available 24 hours/day Type of Home: House Home Access: Stairs to enter Entrance Stairs-Rails: Can reach both Entrance Stairs-Number of Steps: 3 Alternate Level Stairs-Number of Steps: flight Home Layout: Two level;Able to live on main level with bedroom/bathroom Home Equipment: None      Prior Function Prior Level of Function : Independent/Modified Independent                     Hand Dominance        Extremity/Trunk Assessment   Upper Extremity Assessment Upper Extremity Assessment: Overall WFL for tasks assessed    Lower Extremity Assessment Lower Extremity Assessment: Overall WFL for tasks assessed    Cervical / Trunk Assessment Cervical / Trunk Assessment: Neck  Surgery  Communication   Communication: No difficulties  Cognition Arousal/Alertness: Awake/alert Behavior During Therapy: WFL for tasks assessed/performed Overall Cognitive Status: Within Functional Limits for tasks assessed                                          General Comments General comments (skin integrity, edema, etc.): VSS on RA    Exercises      Assessment/Plan    PT Assessment Patient does not need any further PT services  PT Problem List         PT Treatment Interventions      PT Goals (Current goals can be found in the Care Plan section)       Frequency     Barriers to discharge        Co-evaluation               AM-PAC PT "6 Clicks" Mobility  Outcome Measure Help needed turning from your back to your side while in a flat bed without using bedrails?: None Help needed moving from lying on your back to sitting on the side of a flat bed without using bedrails?: None Help needed moving to and from a bed to a chair (including a wheelchair)?: None Help needed standing up from a chair using your arms (e.g., wheelchair or bedside chair)?: None Help needed to walk in hospital room?: None Help needed climbing 3-5 steps with a railing? : None 6 Click Score: 24    End of Session Equipment Utilized During Treatment: Cervical collar Activity Tolerance: Patient tolerated treatment well Patient left: in bed;with call bell/phone within reach;with family/visitor present Nurse Communication: Mobility status PT Visit Diagnosis: Other abnormalities of gait and mobility (R26.89)    Time: 0076-2263 PT Time Calculation (min) (ACUTE ONLY): 9 min   Charges:   PT Evaluation $PT Eval Low Complexity: 1 Low          Arlyss Gandy, PT, DPT Acute Rehabilitation Pager: [phone] Office [phone]   Arlyss Gandy 04/02/2021, 8:19 AM

## 2021-04-02 NOTE — Anesthesia Postprocedure Evaluation (Signed)
Anesthesia Post Note  Patient: Chad Campbell  Procedure(s) Performed: CERVICAL FIVE-SIX ANTERIOR CERVICAL DECOMPRESSION/DISCECTOMY FUSION WITH EXTENSION OF CERVICAL SIX-SEVEN FUSION (Spine Cervical)     Patient location during evaluation: PACU Anesthesia Type: General Level of consciousness: awake and alert Pain management: pain level controlled Vital Signs Assessment: post-procedure vital signs reviewed and stable Respiratory status: spontaneous breathing, nonlabored ventilation, respiratory function stable and patient connected to nasal cannula oxygen Cardiovascular status: blood pressure returned to baseline and stable Postop Assessment: no apparent nausea or vomiting Anesthetic complications: no   No notable events documented.  Last Vitals:  Vitals:   04/01/21 2353 04/02/21 0545  BP: 124/77 (!) 149/84  Pulse: 97 84  Resp: 18 18  Temp: 36.7 C 36.7 C  SpO2: 95% 96%    Last Pain:  Vitals:   04/02/21 0554  TempSrc:   PainSc: Asleep                 Romario Tith S

## 2021-04-02 NOTE — Discharge Summary (Signed)
Physician Discharge Summary  Patient ID: Chad Campbell MRN: [id number] DOB/AGE: 1975/08/03 45 y.o.  Admit date: 04/01/2021 Discharge date: 04/02/2021  Admission Diagnoses: cervical stenosis    Discharge Diagnoses: same   Discharged Condition: good  Hospital Course: The patient was admitted on 04/01/2021 and taken to the operating room where the patient underwent acdf c5-6. The patient tolerated the procedure well and was taken to the recovery room and then to the floor in stable condition. The hospital course was routine. There were no complications. The wound remained clean dry and intact. Pt had appropriate neck soreness. No complaints of arm pain or new N/T/W. The patient remained afebrile with stable vital signs, and tolerated a regular diet. The patient continued to increase activities, and pain was well controlled with oral pain medications.   Consults: None  Significant Diagnostic Studies:  Results for orders placed or performed during the hospital encounter of 04/01/21  ABO/Rh  Result Value Ref Range   ABO/RH(D)      O POS Performed at [facility], [address] [place], [place] [address]     DG Cervical Spine 2 or 3 views  Result Date: 04/01/2021 CLINICAL DATA:  C5-6 fusion. EXAM: CERVICAL SPINE - 2-3 VIEW COMPARISON:  02/17/2021 FINDINGS: ACDF C5-6.  Anterior plate and interbody spacer in good position. Previous hardware at C6-7 appears to have  been removed. IMPRESSION: ACDF C5-6. Electronically Signed   By: Marlan Palau M.D.   On: 04/01/2021 15:59   DG C-Arm 1-60 Min-No Report  Result Date: 04/01/2021 Fluoroscopy was utilized by the requesting physician.  No radiographic interpretation.   DG C-Arm 1-60 Min-No Report  Result Date: 04/01/2021 Fluoroscopy was utilized by the requesting physician.  No radiographic interpretation.   DG C-Arm 1-60 Min-No Report  Result Date: 04/01/2021 Fluoroscopy was utilized by the requesting physician.  No  radiographic interpretation.    Antibiotics:  Anti-infectives (From admission, onward)    Start     Dose/Rate Route Frequency Ordered Stop   04/01/21 1700  ceFAZolin (ANCEF) IVPB 2g/100 mL premix        2 g 200 mL/hr over 30 Minutes Intravenous Every 8 hours 04/01/21 1610 04/02/21 0112   04/01/21 0915  ceFAZolin (ANCEF) IVPB 2g/100 mL premix        2 g 200 mL/hr over 30 Minutes Intravenous On call to O.R. 04/01/21 0907 04/01/21 1216       Discharge Exam: Blood pressure 121/79, pulse 93, temperature 98.4 F (36.9 C), temperature source Oral, resp. rate 20, height 5\' 10"  (1.778 m), weight 93 kg, SpO2 98 %. Neurologic: Grossly normal Dressing dry  Discharge Medications:   Allergies as of 04/02/2021   No Known Allergies      Medication List     STOP taking these medications    cyclobenzaprine 5 MG tablet Commonly known as: FLEXERIL   lidocaine 5 % Commonly known as: Lidoderm       TAKE these medications    HYDROcodone-acetaminophen 7.5-325 MG tablet Commonly known as: NORCO Take 1 tablet by mouth every 4 (four) hours as needed for moderate pain ((score 4 to 6)).   methocarbamol 500 MG tablet Commonly known as: ROBAXIN Take 1 tablet (500 mg total) by mouth every 6 (six) hours as needed for muscle spasms.        Disposition: home   Final Dx: acdf c5-6  Discharge Instructions      Remove dressing in 72 hours   Complete by: As directed  Call MD for:  difficulty breathing, headache or visual disturbances   Complete by: As directed    Call MD for:  persistant nausea and vomiting   Complete by: As directed    Call MD for:  redness, tenderness, or signs of infection (pain, swelling, redness, odor or green/yellow discharge around incision site)   Complete by: As directed    Call MD for:  severe uncontrolled pain   Complete by: As directed    Call MD for:  temperature >100.4   Complete by: As directed    Diet - low sodium heart healthy   Complete by: As  directed    Incentive spirometry RT   Complete by: As directed    Increase activity slowly   Complete by: As directed           Signed: Tia Alert 04/02/2021, 9:13 AM

## 2021-04-02 NOTE — Progress Notes (Signed)
Patient awaiting transport via wheelchair by NT for discharge home; in no acute distress nor complaints of pain nor discomfort; incision on his anterior neck with honeycomb dressing and is clean, dry and intact with Aspen collar on; room was checked and accounted for all his belongings; discharge instructions concerning his medications, incision care, follow up appointment and when to call the doctor as needed were all discussed with patient and his mother by RN and both verbalized understanding on the instructions given.

## 2021-04-02 NOTE — Plan of Care (Signed)

## 2021-04-02 NOTE — Discharge Instructions (Signed)
Wound Care Leave incision open to air. You may shower. Do not scrub directly on incision.  Do not put any creams, lotions, or ointments on incision. Activity Walk each and every day, increasing distance each day. No lifting greater than 5 lbs.  Avoid excessive neck motion. No driving for 2 weeks; may ride as a passenger locally. Wear neck brace at all times except when showering.  If provided soft collar, may wear for comfort unless otherwise instructed. Diet Resume your normal diet.  Return to Work Will be discussed at you follow up appointment. Call Your Doctor If Any of These Occur Redness, drainage, or swelling at the wound.  Temperature greater than 101 degrees. Severe pain not relieved by pain medication. Increased difficulty swallowing. Incision starts to come apart. Follow Up Appt Call today for appointment in 2-3 weeks ([phone]) or for problems.  If you have any hardware placed in your spine, you will need an x-ray before your appointment.  

## 2021-04-02 NOTE — Progress Notes (Signed)
OT Cancellation Note  Patient Details Name: Chad Campbell MRN: [id number] DOB: 1975-09-04   Cancelled Treatment:    Reason Eval/Treat Not Completed: OT screened, no needs identified, discussed status with both PT and patient and both in agreement. will sign off  Ritesh Opara OTR/L acute rehab services Office: [phone]   Wilhemena Durie 04/02/2021, 9:11 AM

## 2021-04-04 ENCOUNTER — Encounter (HOSPITAL_COMMUNITY): Payer: Self-pay | Admitting: Neurological Surgery

## 2021-05-25 DIAGNOSIS — M4802 Spinal stenosis, cervical region: Secondary | ICD-10-CM | POA: Diagnosis not present

## 2021-05-27 DIAGNOSIS — F419 Anxiety disorder, unspecified: Secondary | ICD-10-CM | POA: Diagnosis not present

## 2021-07-05 DIAGNOSIS — F419 Anxiety disorder, unspecified: Secondary | ICD-10-CM | POA: Diagnosis not present

## 2021-09-21 DIAGNOSIS — M542 Cervicalgia: Secondary | ICD-10-CM | POA: Diagnosis not present

## 2021-09-21 DIAGNOSIS — Z6829 Body mass index (BMI) 29.0-29.9, adult: Secondary | ICD-10-CM | POA: Diagnosis not present

## 2021-09-21 DIAGNOSIS — M4802 Spinal stenosis, cervical region: Secondary | ICD-10-CM | POA: Diagnosis not present

## 2021-09-21 DIAGNOSIS — R519 Headache, unspecified: Secondary | ICD-10-CM | POA: Diagnosis not present

## 2021-11-07 ENCOUNTER — Ambulatory Visit: Payer: BC Managed Care – PPO | Admitting: Neurology

## 2021-11-07 ENCOUNTER — Encounter: Payer: Self-pay | Admitting: Neurology

## 2021-11-07 VITALS — BP 133/89 | HR 71 | Ht 71.0 in | Wt 210.0 lb

## 2021-11-07 DIAGNOSIS — H93A1 Pulsatile tinnitus, right ear: Secondary | ICD-10-CM

## 2021-11-07 DIAGNOSIS — H5711 Ocular pain, right eye: Secondary | ICD-10-CM

## 2021-11-07 DIAGNOSIS — R51 Headache with orthostatic component, not elsewhere classified: Secondary | ICD-10-CM | POA: Diagnosis not present

## 2021-11-07 DIAGNOSIS — H5460 Unqualified visual loss, one eye, unspecified: Secondary | ICD-10-CM

## 2021-11-07 DIAGNOSIS — R519 Headache, unspecified: Secondary | ICD-10-CM | POA: Diagnosis not present

## 2021-11-07 DIAGNOSIS — H539 Unspecified visual disturbance: Secondary | ICD-10-CM | POA: Diagnosis not present

## 2021-11-07 DIAGNOSIS — H5703 Miosis: Secondary | ICD-10-CM

## 2021-11-07 DIAGNOSIS — H02409 Unspecified ptosis of unspecified eyelid: Secondary | ICD-10-CM

## 2021-11-07 DIAGNOSIS — Z8249 Family history of ischemic heart disease and other diseases of the circulatory system: Secondary | ICD-10-CM

## 2021-11-07 NOTE — Progress Notes (Signed)
[id number]    Provider:  Dr Jaynee Eagles Requesting Provider: Marvis Moeller, NP Primary Care Provider:  [facility] [place]  CC:  headache  HPI:  Chad Campbell is a 46 y.o. male here as requested by Marvis Moeller, NP for headaches. PMHX cervical radiculopathy.  A year ago his right eye was bothering him, saw eye doctor, like a pulling sensation, not shooting, constant 24x7, headaches on the right side, pulsating/pounding.throbbing, light and sound sensitivity, nausea. Never had headaches, just started one day and progressively made worse. He was thinking it was due to neck pain. Ongoing at least one year. Constant headache. The only relief is when sleep, when supine, laying down makes it goes away IF the eyes are closed, if the eyes are open even laying down won;t help. 7/10 contsantly. Aunt with migraines. Aunt and mother had a brain aneurysm. He is having vision problems in the right eye, blurry, shuts the eye to get relief, watering, no rashes or lesions ever seen, no inciting events when this started, he has pulsating in the ear. Also diplopia, opthalmoplegia at times. No inciting events or new meds at the time. No other focal neurologic deficits, associated symptoms, inciting events or modifiable factors.   Reviewed notes, labs and imaging from outside physicians, which showed :    MRI cervical spine 01-12-2021: IMPRESSION: 1. ACDF C6-7 with solid fusion.  Negative for stenosis 2. Mild to moderate central and left-sided disc protrusion at C5-6 causing mild spinal stenosis.  [facility] Fenton Malling, NP, he returns for 58-month follow-up, he continues to have paresthesias in his bilateral hands left greater than right, wearing wrist splints at night, his ptosis and myosis persist unchanged as well as his xerosis, he seen ophthalmology and obtained eyedrops which have offered no relief, he also notes continued  headaches that began in his temple region and radiate posteriorly, the headaches happen multiple times a week and some are similar to her migraines, also decreased range of motion in the cervical spine, very unusual.  Review of Systems: Patient complains of symptoms per HPI as well as the following symptoms eye pain. Pertinent negatives and positives per HPI. All others negative.   Social History   Socioeconomic History   Marital status: Divorced    Spouse name: Not on file   Number of children: 2   Years of education: Not on file   Highest education level: Not on file  Occupational History   Not on file  Tobacco Use   Smoking status: Never   Smokeless tobacco: Never  Vaping Use   Vaping Use: Never used  Substance and Sexual Activity   Alcohol use: Yes    Comment: occasional   Drug use: Never   Sexual activity: Yes  Other Topics Concern   Not on file  Social History Narrative   Not on file   Social Determinants of Health   Financial Resource Strain: Not on file  Food Insecurity: Not on file  Transportation Needs: Not on file  Physical Activity: Not on file  Stress: Not on file  Social Connections: Not on file  Intimate Partner Violence: Not on file    Family History  Problem Relation Age of Onset   Aneurysm Mother    Aneurysm Maternal Aunt     History reviewed. No pertinent past medical history.  Patient Active Problem List   Diagnosis Date Noted   Cervical radiculopathy at C6 04/01/2021  Past Surgical History:  Procedure Laterality Date   ANTERIOR CERVICAL DECOMP/DISCECTOMY FUSION N/A 04/01/2021   Procedure: CERVICAL FIVE-SIX ANTERIOR CERVICAL DECOMPRESSION/DISCECTOMY FUSION WITH EXTENSION OF CERVICAL SIX-SEVEN FUSION;  Surgeon: Dawley, Alan Mulder, DO;  Location: [facility] OR;  Service: Neurosurgery;  Laterality: N/A;   CERVICAL SPINE SURGERY     KNEE SURGERY Left 1996   tendon repair and patella   WISDOM TOOTH EXTRACTION Bilateral 1997    Current Outpatient  Medications  Medication Sig Dispense Refill   sertraline (ZOLOFT) 25 MG tablet Take 25 mg by mouth daily.     No current facility-administered medications for this visit.    Allergies as of 11/07/2021   (No Known Allergies)    Vitals: BP 133/89   Pulse 71   Ht 5\' 11"  (1.803 m)   Wt 210 lb (95.3 kg)   BMI 29.29 kg/m  Last Weight:  Wt Readings from Last 1 Encounters:  11/07/21 210 lb (95.3 kg)   Last Height:   Ht Readings from Last 1 Encounters:  11/07/21 5\' 11"  (1.803 m)     Physical exam: Exam: Gen: NAD, conversant, well nourised, well groomed                     CV: RRR, no MRG. No Carotid Bruits. No peripheral edema, warm, nontender Eyes: Conjunctivae clear without exudates or hemorrhage  Neuro: Detailed Neurologic Exam  Speech:    Speech is normal; fluent and spontaneous with normal comprehension.  Cognition:    The patient is oriented to person, place, and time;     recent and remote memory intact;     language fluent;     normal attention, concentration,     fund of knowledge Cranial Nerves:    The pupils are round, and reactive to light but unequal right 34mm and left 70mm. The fundi are flat. Visual fields are full to finger confrontation. Extraocular movements are intact. Trigeminal sensation is intact and the muscles of mastication are normal. Asymmtry of lids. The palate elevates in the midline. Hearing intact. Voice is normal. Shoulder shrug is normal. The tongue has normal motion without fasciculations.   Coordination:    Normal   Gait:     normal.   Motor Observation:    No asymmetry, no atrophy, and no involuntary movements noted. Tone:    Normal muscle tone.    Posture:    Posture is normal. normal erect    Strength:    Strength is V/V in the upper and lower limbs.      Sensation: intact to LT     Reflex Exam:  DTR's:    Deep tendon reflexes in the upper and lower extremities are normal bilaterally.   Toes:    The toes are downgoing  bilaterally.   Clonus:    Clonus is absent.    Assessment/Plan:  46 year old with concerning right eye pain ongling for one year with atypical features: Could be migrainous but very atypical for a migraine disorder.   MRI brain due to concerning symptoms of morning headaches, positional and chronic daily headaches,vision changes and right eye pain, temporal right headaches, worsening headaches  to look for space occupying mass, chiari or intracranial hypertension (pseudotumor), strokes, malignancies, vasculidities, demyelination(multiple sclerosis) or other, Also pulsatile tinnitus, ptosis, pupil asymmetry - need MRA of the head to rule out aneurysm. MRI orbits to evaluate for problems in the globe, orbital pseudotumor(hurts to move eye), optic neuropathy, tolosa hunt, cavernous sinus lesion  and wide variety of other etiologies. Also unusual asymmetry of the lids and pupil diameter difference, Fhx 1st degree ophthalmic aneurysm.    Orders Placed This Encounter  Procedures   MR BRAIN W WO CONTRAST   MR ANGIO HEAD WO CONTRAST   MR ORBITS W WO CONTRAST   CBC with Differential/Platelets   Comprehensive metabolic panel   TSH   Sedimentation rate   C-reactive protein   Ambulatory referral to Ophthalmology    Cc: Council Mechanic, NP,  [place] [place]  Naomie Dean, MD  [facility] [address] [place], [place] [address]  Phone [phone] Fax [phone]  I spent 60 minutes minutes of face-to-face and non-face-to-face time with patient on the  1. Positional headache   2. Pulsatile tinnitus of right ear   3. Vision changes   4. Worsening headaches   5. Morning headache   6. Chronic daily headache   7. Right temporal headache   8. FHx: brain aneurysm   9. Pain in right eye   10. 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  This included previsit chart review, lab  review, study review, order entry, electronic health record documentation, patient education on the different diagnostic and therapeutic options, counseling and coordination of care, risks and benefits of management, compliance, or risk factor reduction

## 2021-11-07 NOTE — Patient Instructions (Signed)
MRIs of the brain/eyes and MRA blood vessels Blood work Evaluation ophthalmology: Dr. Dione Booze Come back after that to discuss  Orders Placed This Encounter  Procedures   MR BRAIN W WO CONTRAST   MR ANGIO HEAD WO CONTRAST   MR ORBITS W WO CONTRAST   CBC with Differential/Platelets   Comprehensive metabolic panel   TSH   Sedimentation rate   C-reactive protein   Ambulatory referral to Ophthalmology

## 2021-11-08 ENCOUNTER — Telehealth: Payer: Self-pay | Admitting: Neurology

## 2021-11-08 LAB — CBC WITH DIFFERENTIAL/PLATELET
Basophils Absolute: 0.1 10*3/uL (ref 0.0–0.2)
Basos: 1 %
EOS (ABSOLUTE): 0.1 10*3/uL (ref 0.0–0.4)
Eos: 1 %
Hematocrit: 46 % (ref 37.5–51.0)
Hemoglobin: 15.2 g/dL (ref 13.0–17.7)
Immature Grans (Abs): 0 10*3/uL (ref 0.0–0.1)
Immature Granulocytes: 0 %
Lymphocytes Absolute: 1.4 10*3/uL (ref 0.7–3.1)
Lymphs: 28 %
MCH: 30.5 pg (ref 26.6–33.0)
MCHC: 33 g/dL (ref 31.5–35.7)
MCV: 92 fL (ref 79–97)
Monocytes Absolute: 0.5 10*3/uL (ref 0.1–0.9)
Monocytes: 10 %
Neutrophils Absolute: 3.1 10*3/uL (ref 1.4–7.0)
Neutrophils: 60 %
Platelets: 289 10*3/uL (ref 150–450)
RBC: 4.99 x10E6/uL (ref 4.14–5.80)
RDW: 13.2 % (ref 11.6–15.4)
WBC: 5.2 10*3/uL (ref 3.4–10.8)

## 2021-11-08 LAB — COMPREHENSIVE METABOLIC PANEL
ALT: 26 IU/L (ref 0–44)
AST: 28 IU/L (ref 0–40)
Albumin/Globulin Ratio: 1.9 (ref 1.2–2.2)
Albumin: 4.9 g/dL (ref 4.1–5.1)
Alkaline Phosphatase: 95 IU/L (ref 44–121)
BUN/Creatinine Ratio: 13 (ref 9–20)
BUN: 14 mg/dL (ref 6–24)
Bilirubin Total: 0.5 mg/dL (ref 0.0–1.2)
CO2: 23 mmol/L (ref 20–29)
Calcium: 9.6 mg/dL (ref 8.7–10.2)
Chloride: 101 mmol/L (ref 96–106)
Creatinine, Ser: 1.12 mg/dL (ref 0.76–1.27)
Globulin, Total: 2.6 g/dL (ref 1.5–4.5)
Glucose: 90 mg/dL (ref 70–99)
Potassium: 4.9 mmol/L (ref 3.5–5.2)
Sodium: 140 mmol/L (ref 134–144)
Total Protein: 7.5 g/dL (ref 6.0–8.5)
eGFR: 84 mL/min/{1.73_m2} (ref >59)

## 2021-11-08 LAB — TSH: TSH: 1.24 u[IU]/mL (ref 0.450–4.500)

## 2021-11-08 LAB — SEDIMENTATION RATE: Sed Rate: 2 mm/hr (ref 0–15)

## 2021-11-08 LAB — C-REACTIVE PROTEIN: CRP: <1 mg/L (ref 0–10)

## 2021-11-08 NOTE — Telephone Encounter (Signed)
90 mins MRI brain w/wo & MRI orbits w/wo Dr. Valaria Good Berkley Harvey: [id number] exp. 11/08/21-01/06/22 scheduled at [facility] 11/09/21 at 8am  30 mins MRA head wo contrast Dr. Thereasa Parkin: [id number] exp. 11/08/21-01/06/22 scheduled at [facility] 11/15/21 at 7:30am

## 2021-11-08 NOTE — Telephone Encounter (Signed)
Referral for Ophthalmology sent to [facility] [phone]. 

## 2021-11-09 ENCOUNTER — Ambulatory Visit: Payer: BC Managed Care – PPO

## 2021-11-09 DIAGNOSIS — H5460 Unqualified visual loss, one eye, unspecified: Secondary | ICD-10-CM | POA: Diagnosis not present

## 2021-11-09 DIAGNOSIS — H93A1 Pulsatile tinnitus, right ear: Secondary | ICD-10-CM

## 2021-11-09 DIAGNOSIS — R519 Headache, unspecified: Secondary | ICD-10-CM

## 2021-11-09 DIAGNOSIS — H02409 Unspecified ptosis of unspecified eyelid: Secondary | ICD-10-CM

## 2021-11-09 DIAGNOSIS — H539 Unspecified visual disturbance: Secondary | ICD-10-CM

## 2021-11-09 DIAGNOSIS — H5711 Ocular pain, right eye: Secondary | ICD-10-CM

## 2021-11-09 DIAGNOSIS — Z8249 Family history of ischemic heart disease and other diseases of the circulatory system: Secondary | ICD-10-CM

## 2021-11-09 DIAGNOSIS — R51 Headache with orthostatic component, not elsewhere classified: Secondary | ICD-10-CM | POA: Diagnosis not present

## 2021-11-09 DIAGNOSIS — H5703 Miosis: Secondary | ICD-10-CM

## 2021-11-09 MED ORDER — GADOBENATE DIMEGLUMINE 529 MG/ML IV SOLN
20.0000 mL | Freq: Once | INTRAVENOUS | Status: AC | PRN
  Administered 2021-11-09: 20 mL via INTRAVENOUS

## 2021-11-15 ENCOUNTER — Ambulatory Visit (INDEPENDENT_AMBULATORY_CARE_PROVIDER_SITE_OTHER): Payer: BC Managed Care – PPO

## 2021-11-15 ENCOUNTER — Telehealth: Payer: Self-pay | Admitting: *Deleted

## 2021-11-15 DIAGNOSIS — R51 Headache with orthostatic component, not elsewhere classified: Secondary | ICD-10-CM

## 2021-11-15 DIAGNOSIS — H539 Unspecified visual disturbance: Secondary | ICD-10-CM | POA: Diagnosis not present

## 2021-11-15 DIAGNOSIS — H5460 Unqualified visual loss, one eye, unspecified: Secondary | ICD-10-CM

## 2021-11-15 DIAGNOSIS — H02409 Unspecified ptosis of unspecified eyelid: Secondary | ICD-10-CM

## 2021-11-15 DIAGNOSIS — H5703 Miosis: Secondary | ICD-10-CM

## 2021-11-15 DIAGNOSIS — R519 Headache, unspecified: Secondary | ICD-10-CM

## 2021-11-15 DIAGNOSIS — H93A1 Pulsatile tinnitus, right ear: Secondary | ICD-10-CM

## 2021-11-15 DIAGNOSIS — H5711 Ocular pain, right eye: Secondary | ICD-10-CM

## 2021-11-15 DIAGNOSIS — Z8249 Family history of ischemic heart disease and other diseases of the circulatory system: Secondary | ICD-10-CM

## 2021-11-15 NOTE — Telephone Encounter (Signed)
Called pt & let him know that MRI brain and orbits are unremarkable, nothing concerning which is great news. Pt was very appreciative and verbalized understanding. He did not have any questions at the time of the call.

## 2021-11-15 NOTE — Telephone Encounter (Signed)
-----   Message from Anson Fret, MD sent at 11/15/2021 12:01 PM EDT ----- MRI of the brain and orbits unremarkable, nothing concerning great news thanks

## 2021-11-17 ENCOUNTER — Telehealth: Payer: Self-pay

## 2021-11-17 NOTE — Telephone Encounter (Signed)
-----   Message from Anson Fret, MD sent at 11/16/2021  7:52 PM EDT ----- Blood vessels of head normal, great news thanks

## 2021-11-17 NOTE — Telephone Encounter (Signed)
I called pt and relayed results. He verbalized understanding and appreciation for the call.  

## 2021-11-21 DIAGNOSIS — H04123 Dry eye syndrome of bilateral lacrimal glands: Secondary | ICD-10-CM | POA: Diagnosis not present

## 2021-11-21 DIAGNOSIS — H40051 Ocular hypertension, right eye: Secondary | ICD-10-CM | POA: Diagnosis not present

## 2021-11-21 DIAGNOSIS — H5711 Ocular pain, right eye: Secondary | ICD-10-CM | POA: Diagnosis not present

## 2021-11-21 DIAGNOSIS — H0102A Squamous blepharitis right eye, upper and lower eyelids: Secondary | ICD-10-CM | POA: Diagnosis not present

## 2022-04-07 DIAGNOSIS — F4322 Adjustment disorder with anxiety: Secondary | ICD-10-CM | POA: Diagnosis not present

## 2022-07-28 DIAGNOSIS — F419 Anxiety disorder, unspecified: Secondary | ICD-10-CM | POA: Diagnosis not present

## 2022-07-28 DIAGNOSIS — Z79899 Other long term (current) drug therapy: Secondary | ICD-10-CM | POA: Diagnosis not present

## 2022-11-24 DIAGNOSIS — F5101 Primary insomnia: Secondary | ICD-10-CM | POA: Diagnosis not present

## 2022-11-24 DIAGNOSIS — F411 Generalized anxiety disorder: Secondary | ICD-10-CM | POA: Diagnosis not present

## 2023-02-09 DIAGNOSIS — F419 Anxiety disorder, unspecified: Secondary | ICD-10-CM | POA: Diagnosis not present

## 2023-02-09 DIAGNOSIS — Z5181 Encounter for therapeutic drug level monitoring: Secondary | ICD-10-CM | POA: Diagnosis not present

## 2023-05-11 DIAGNOSIS — F411 Generalized anxiety disorder: Secondary | ICD-10-CM | POA: Diagnosis not present

## 2023-05-11 DIAGNOSIS — F5101 Primary insomnia: Secondary | ICD-10-CM | POA: Diagnosis not present

## 2023-08-04 IMAGING — CT CT CERVICAL SPINE W/O CM
4 series · 15 of 33 positions shown, 18 images · non-contrast
Comparison: February 05, 2007.

CLINICAL DATA: Acute left-sided neck pain without known injury.

EXAM:
CT CERVICAL SPINE WITHOUT CONTRAST
TECHNIQUE: Multidetector CT imaging of the cervical spine was performed without
intravenous contrast. Multiplanar CT image reconstructions were also
generated.

[Series 5: c_spine 2.0 st · axial · 0.28mm/px · z∈[+878,+992]mm · 5 of 87 slices shown, 7 images]
[im 15/87  soft-tissue]
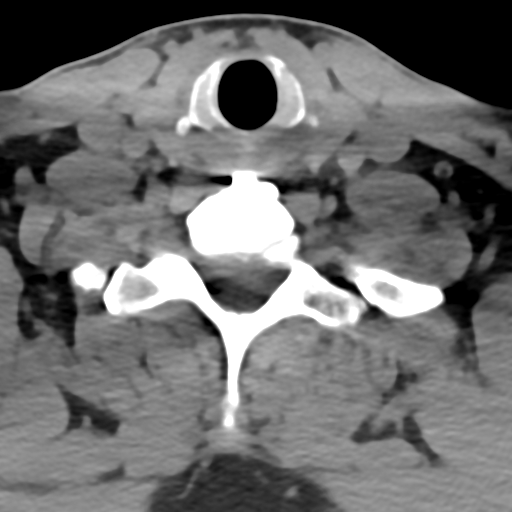
[im 15/87  bone]
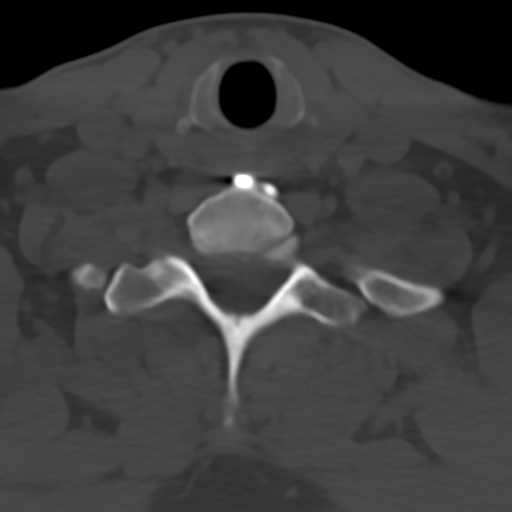
[im 29/87  bone]
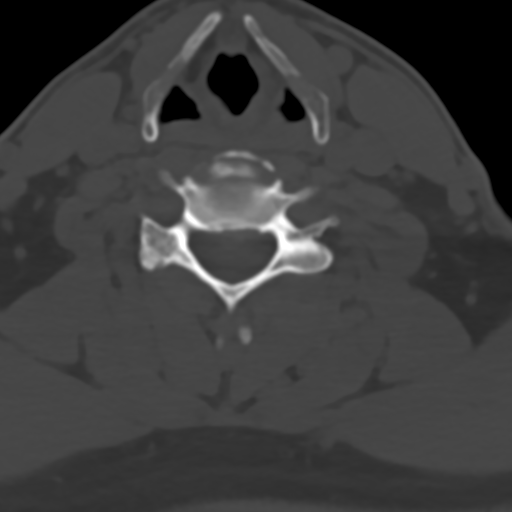
[im 44/87  bone]
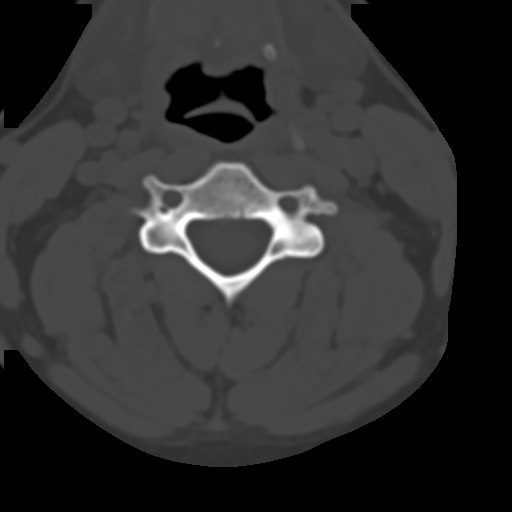
[im 58/87  bone]
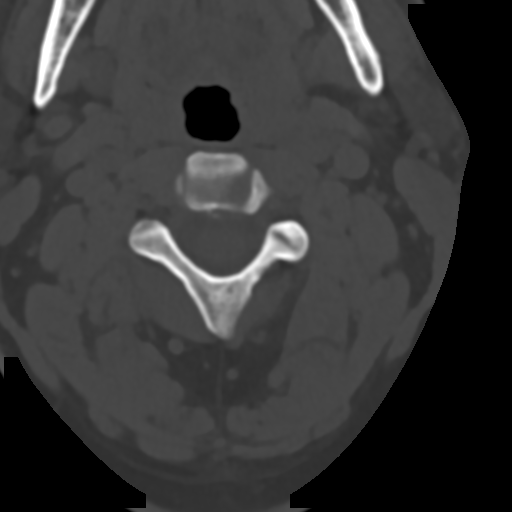
[im 72/87  soft-tissue]
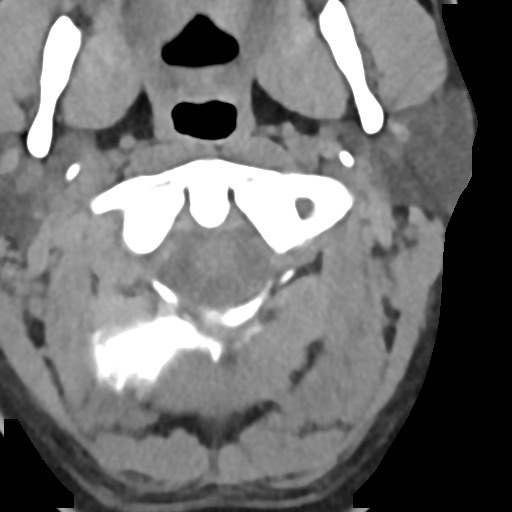
[im 72/87  bone]
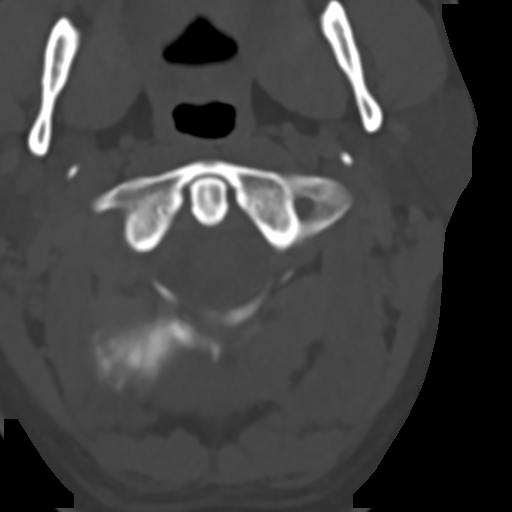

[Series 7: c_spine 2.0 sag bone · sagittal · 0.31mm/px · 5 of 52 slices shown, 6 images]
[im 18/52  bone]
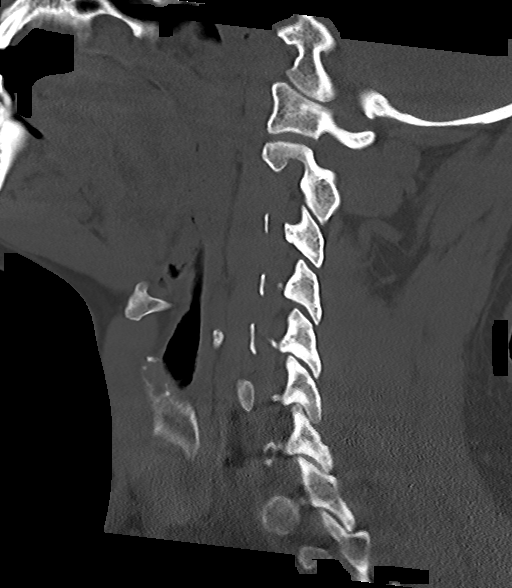
[im 22/52  bone]
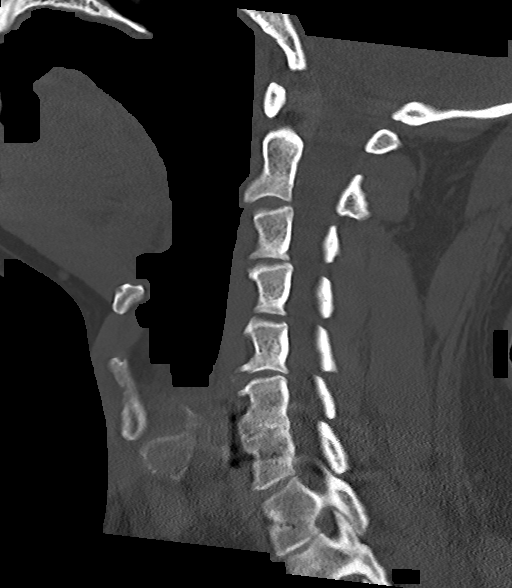
[im 26/52  soft-tissue]
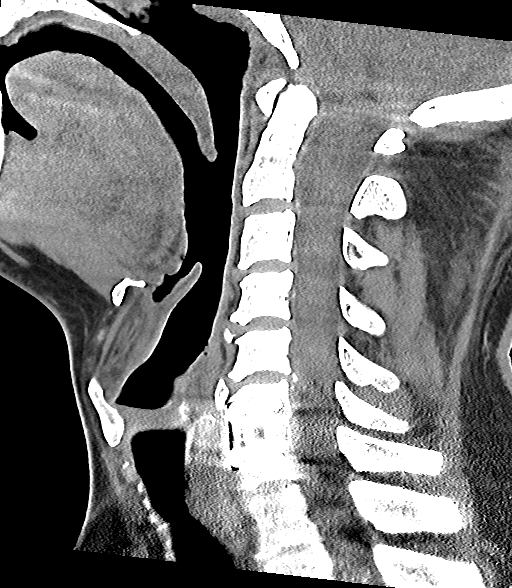
[im 26/52  bone]
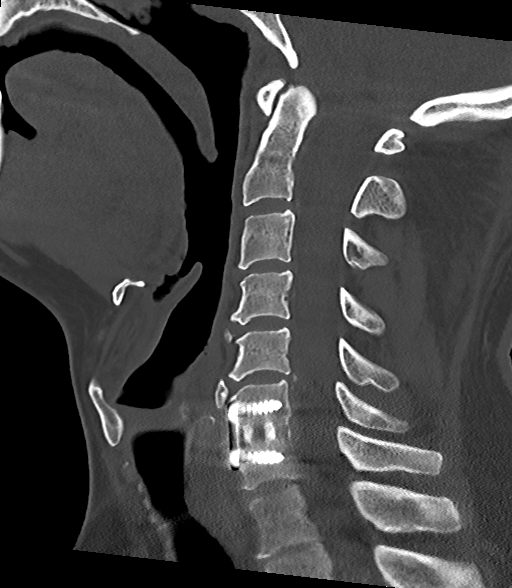
[im 30/52  bone]
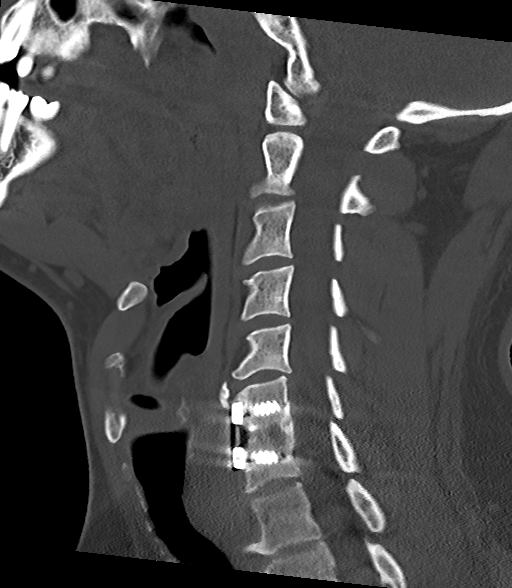
[im 35/52  bone]
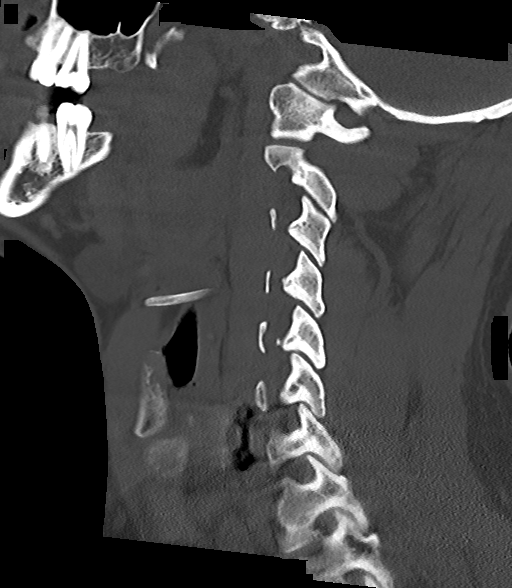

[Series 8: c_spine 2.0 cor bone · coronal · 0.30mm/px · 3 of 64 slices shown]
[im 13/64  bone]
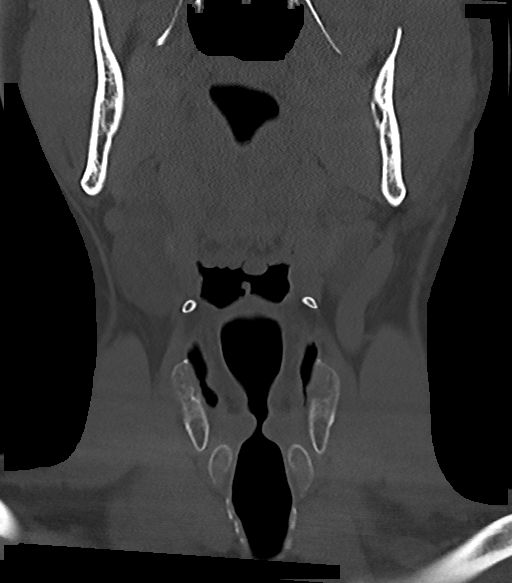
[im 26/64  bone]
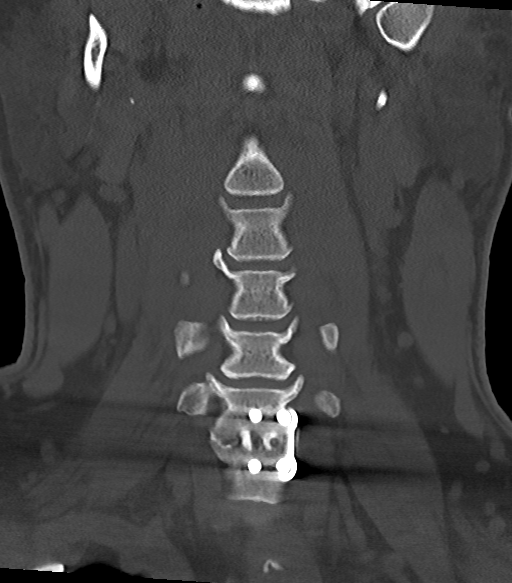
[im 38/64  bone]
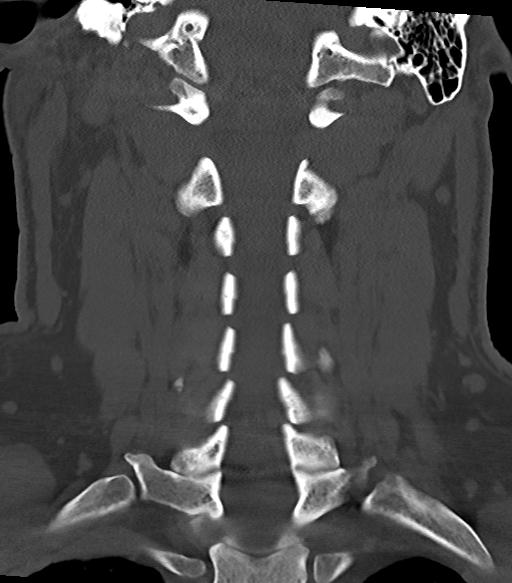

[Series 9: c_spine 2.0 orthogonals · axial · 0.30mm/px · z∈[+860,+889]mm · 2 of 85 slices shown]
[im 15/85  bone]
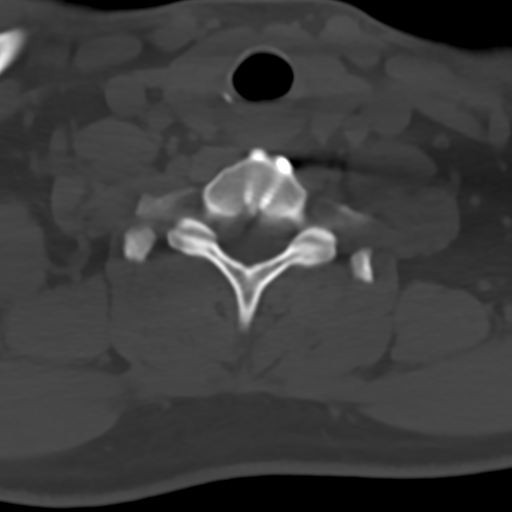
[im 29/85  bone]
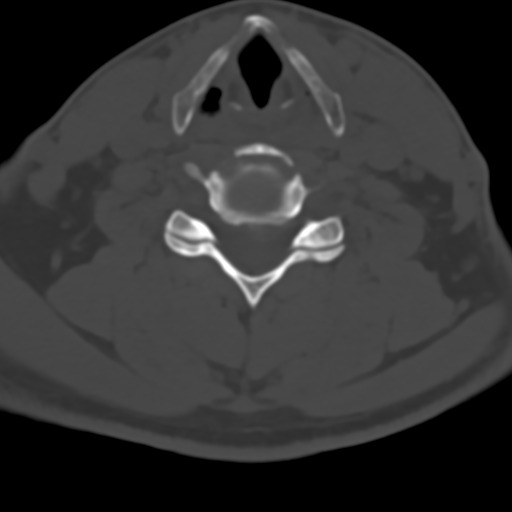

[15 of 33 positions shown; findings below may reference images not displayed]

FINDINGS: Alignment: Normal.

Skull base and vertebrae: No acute fracture. No primary bone lesion
or focal pathologic process.

Soft tissues and spinal canal: No prevertebral fluid or swelling. No
visible canal hematoma.

Disc levels: Status post surgical anterior fusion of C6-7. Mild
anterior osteophyte formation is noted at C4-5 and C5-6.

Upper chest: Negative.

Other: None.
IMPRESSION: Postsurgical and degenerative changes as described above. No acute
abnormality is noted.

## 2023-08-08 IMAGING — MR MR CERVICAL SPINE WO/W CM
5 of 8 series · 27 of 48 positions shown · IV contrast (multihance)
Comparison: CT cervical spine 01/08/2021

CLINICAL DATA: Severe neck pain with left arm pain

EXAM:
MRI CERVICAL SPINE WITHOUT AND WITH CONTRAST
TECHNIQUE: Multiplanar and multiecho pulse sequences of the cervical spine, to
include the craniocervical junction and cervicothoracic junction,
were obtained without and with intravenous contrast.
CONTRAST:  18mL MULTIHANCE GADOBENATE DIMEGLUMINE 529 MG/ML IV SOLN

[Series 2: T2 · sagittal · 3.0mm · 0.66mm/px · 4 of 15 slices shown (1 of 2)]
[im 1/15]
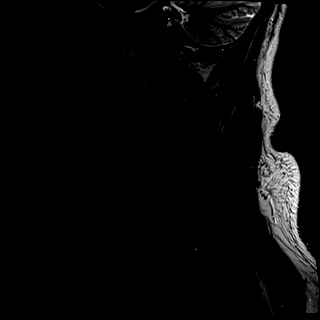
[im 5/15]
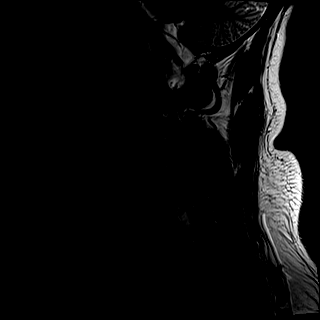
[im 10/15]
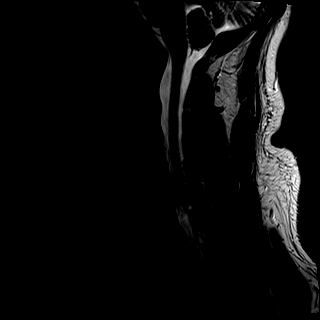
[im 15/15]
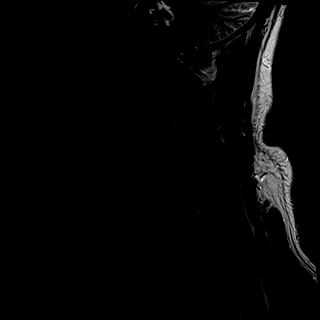

[Series 3: T1 · sagittal · 3.0mm · 0.41mm/px · 4 of 15 slices shown (1 of 3)]
[im 1/15]
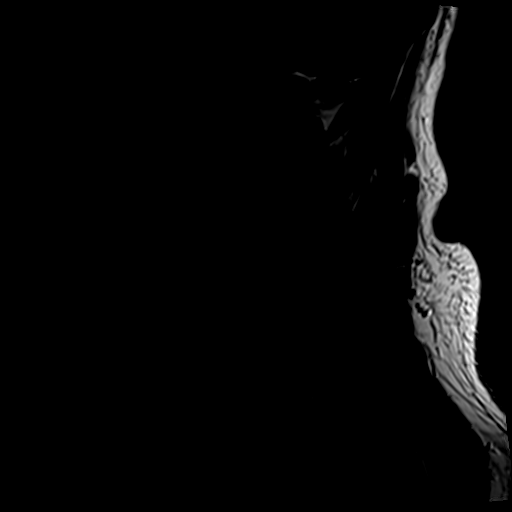
[im 5/15]
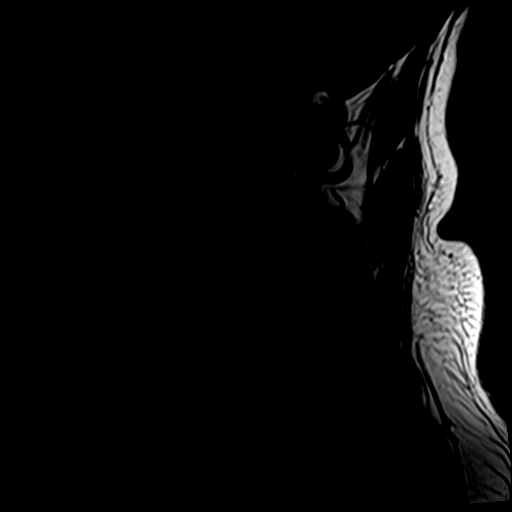
[im 10/15]
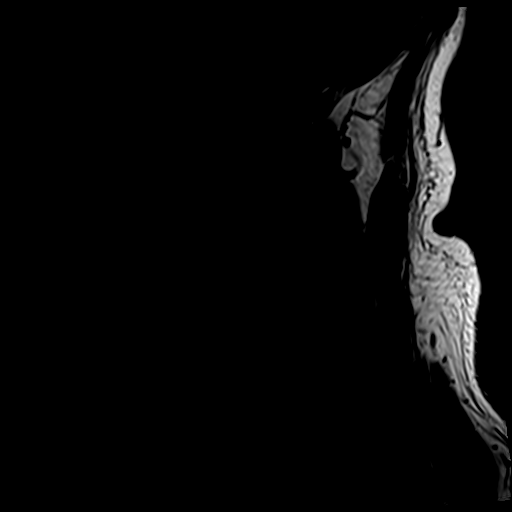
[im 15/15]
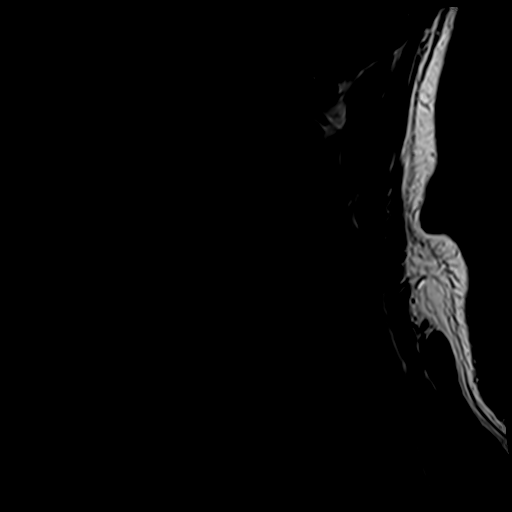

[Series 6: T2 · axial · 3.0mm · 0.70mm/px · z∈[-42,+58]mm · 8 of 28 slices shown (2 of 2)]
[im 1/28]
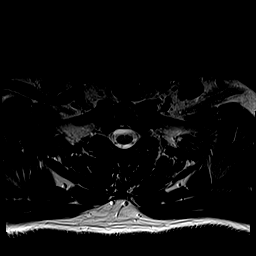
[im 4/28]
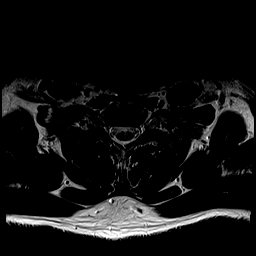
[im 8/28]
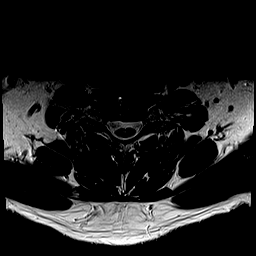
[im 12/28]
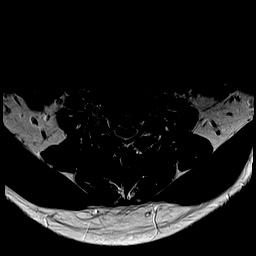
[im 16/28]
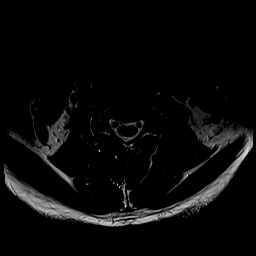
[im 20/28]
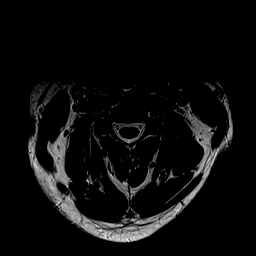
[im 24/28]
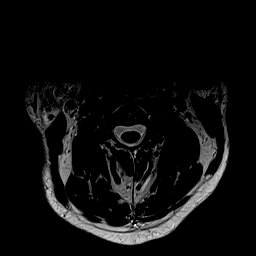
[im 28/28]
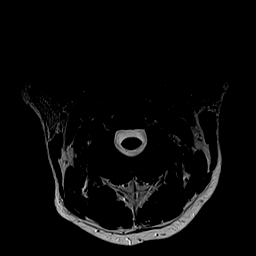

[Series 7: T1 · axial · 3.0mm · 0.35mm/px · z∈[-44,+59]mm · 8 of 29 slices shown (2 of 3)]
[im 1/29]
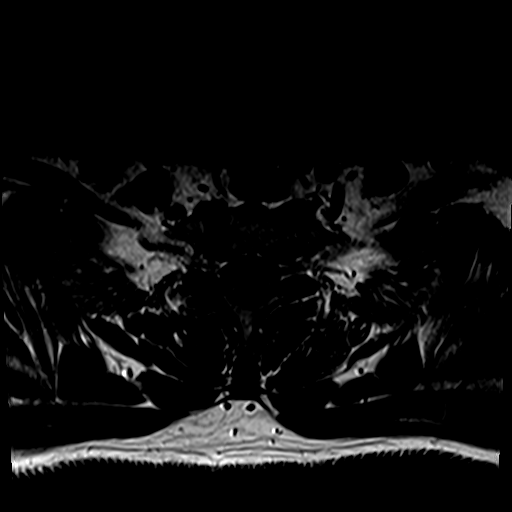
[im 5/29]
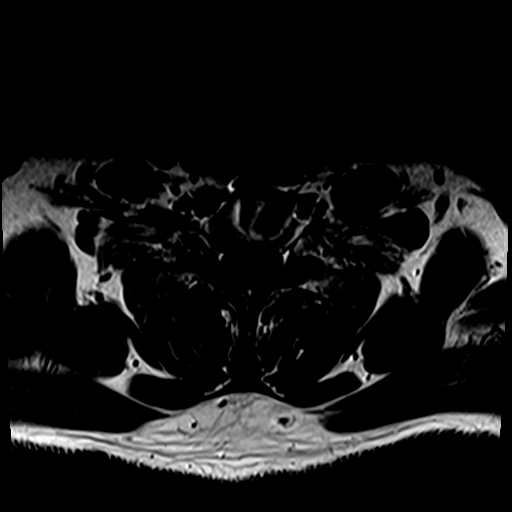
[im 9/29]
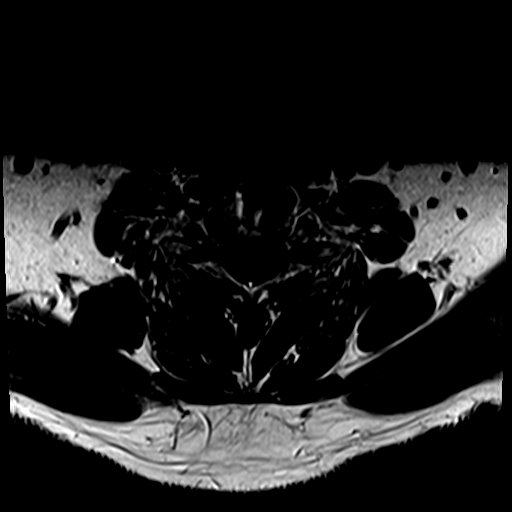
[im 13/29]
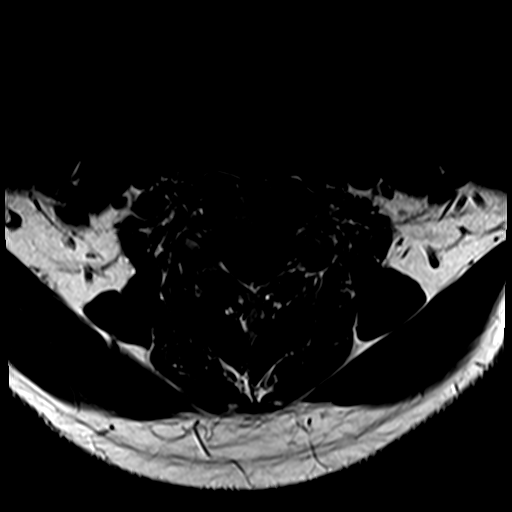
[im 17/29]
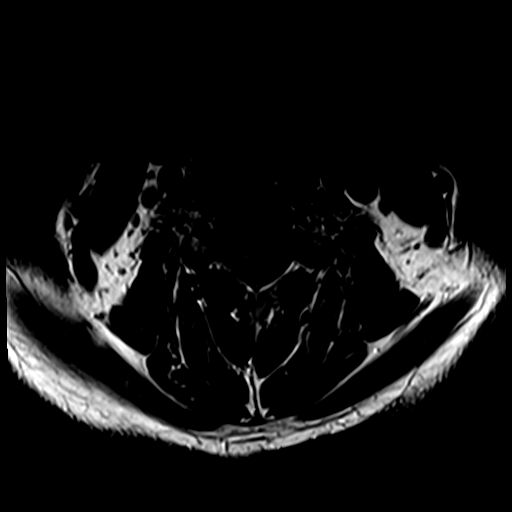
[im 21/29]
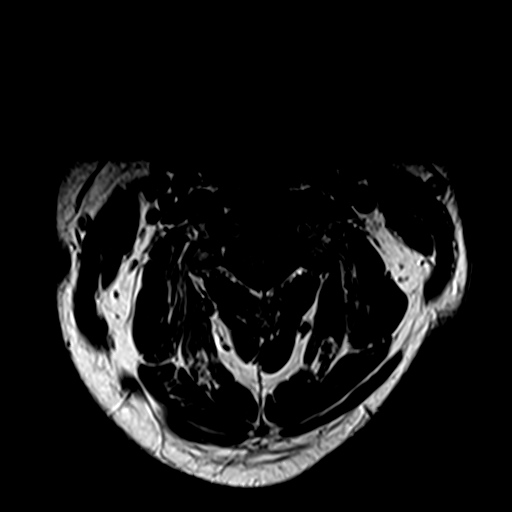
[im 25/29]
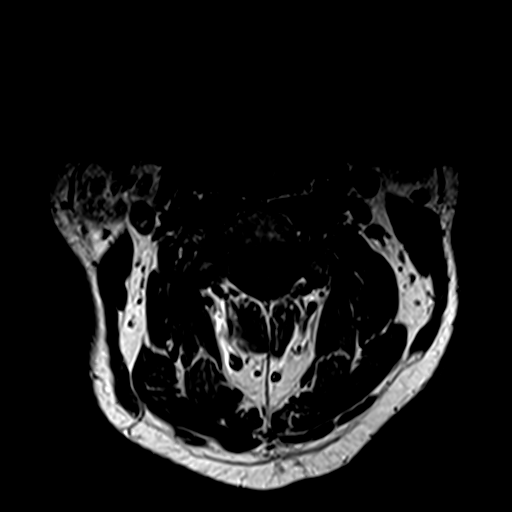
[im 29/29]
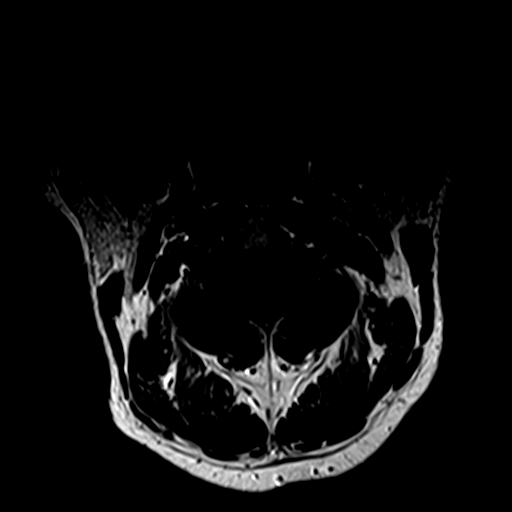

[Series 8: T1 · axial · 3.0mm · 0.35mm/px · z∈[-44,-14]mm · 3 of 29 slices shown (3 of 3)]
[im 1/29]
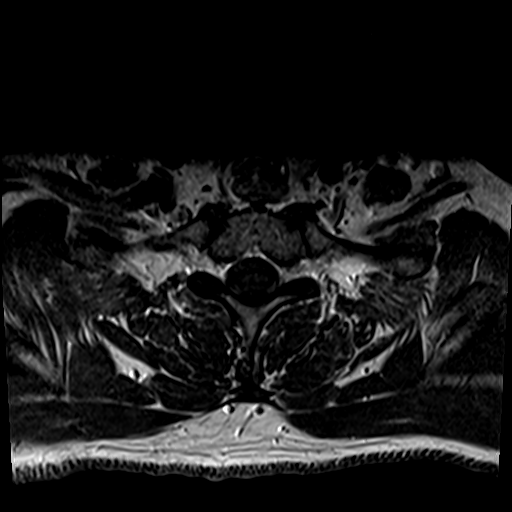
[im 5/29]
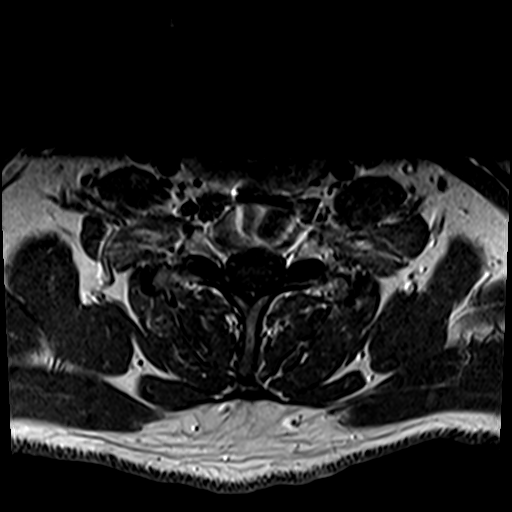
[im 9/29]
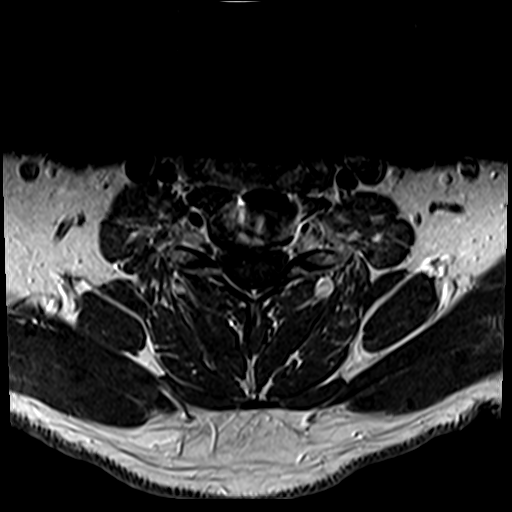

[27 of 48 positions shown; findings below may reference images not displayed]

FINDINGS: Alignment: Normal

Vertebrae: Negative for fracture or mass.  ACDF C6-7

Cord: Normal spinal cord signal. Flattening of the ventral cord at
C5-6 due to disc protrusion.

Posterior Fossa, vertebral arteries, paraspinal tissues: 11 mm right
level 2 lymph node in the neck. 9 mm left level 2 lymph node. These
are not pathologically enlarged. Otherwise negative

Disc levels:

C2-3: Negative

C3-4: Negative

C4-5: Negative

C5-6: Mild to moderate central disc protrusion effacing the ventral
CSF and causing cord flattening and mild spinal stenosis. No change
from the recent CT. This may be slightly left-sided predominant.
Neural foramina patent bilaterally

C6-7: ACDF with solid fusion.  Negative for stenosis

C7-T1: Mild disc bulging.  Negative for stenosis
IMPRESSION: 1. ACDF C6-7 with solid fusion.  Negative for stenosis
2. Mild to moderate central and left-sided disc protrusion at C5-6
causing mild spinal stenosis.

## 2023-11-02 DIAGNOSIS — F411 Generalized anxiety disorder: Secondary | ICD-10-CM | POA: Diagnosis not present

## 2023-11-02 DIAGNOSIS — F5101 Primary insomnia: Secondary | ICD-10-CM | POA: Diagnosis not present
